# Patient Record
Sex: Female | Born: 1995 | Race: White | Hispanic: No | Marital: Single | Smoking: Never smoker
Health system: Southern US, Community
[De-identification: ages and names within clinical notes are randomized; demographics above are authoritative.]

## PROBLEM LIST (undated history)

## (undated) DIAGNOSIS — E669 Obesity, unspecified: Secondary | ICD-10-CM

## (undated) DIAGNOSIS — J45909 Unspecified asthma, uncomplicated: Secondary | ICD-10-CM

## (undated) DIAGNOSIS — N926 Irregular menstruation, unspecified: Secondary | ICD-10-CM

## (undated) HISTORY — DX: Irregular menstruation, unspecified: N92.6

## (undated) HISTORY — DX: Obesity, unspecified: E66.9

## (undated) HISTORY — DX: Unspecified asthma, uncomplicated: J45.909

---
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
## 2015-06-18 ENCOUNTER — Encounter: Payer: Self-pay | Admitting: *Deleted

## 2015-06-24 ENCOUNTER — Encounter: Payer: Self-pay | Admitting: Obstetrics and Gynecology

## 2015-06-24 ENCOUNTER — Ambulatory Visit (INDEPENDENT_AMBULATORY_CARE_PROVIDER_SITE_OTHER): Payer: Medicaid Other | Admitting: Obstetrics and Gynecology

## 2015-06-24 VITALS — BP 109/76 | HR 81 | Ht 61.0 in | Wt 202.5 lb

## 2015-06-24 DIAGNOSIS — Z309 Encounter for contraceptive management, unspecified: Secondary | ICD-10-CM | POA: Diagnosis not present

## 2015-06-24 DIAGNOSIS — E669 Obesity, unspecified: Secondary | ICD-10-CM | POA: Diagnosis not present

## 2015-06-24 DIAGNOSIS — Z01419 Encounter for gynecological examination (general) (routine) without abnormal findings: Secondary | ICD-10-CM | POA: Diagnosis not present

## 2015-06-24 DIAGNOSIS — Z30011 Encounter for initial prescription of contraceptive pills: Secondary | ICD-10-CM | POA: Insufficient documentation

## 2015-06-24 MED ORDER — NORETHIN-ETH ESTRADIOL-FE 0.8-25 MG-MCG PO CHEW: 1.0000 | CHEWABLE_TABLET | Freq: Every day | ORAL | Status: DC

## 2015-06-24 NOTE — Progress Notes (Signed)
  Subjective:     Shannon Alvarado is a 19 y.o. female and is here for a comprehensive physical exam. 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  Social History   Social History  . Marital Status: Single    Spouse Name: N/A  . Number of Children: N/A  . Years of Education: N/A   Occupational History  . Not on file.   Social History Main Topics  . Smoking status: Never Smoker   . Smokeless tobacco: Never Used  . Alcohol Use: No  . Drug Use: No  . Sexual Activity: No   Other Topics Concern  . Not on file   Social History Narrative   Health Maintenance  Topic Date Due  . HIV Screening  06/27/2010  . TETANUS/TDAP  06/27/2014  . INFLUENZA VACCINE  06/01/2015    The following portions of the patient's history were reviewed and updated as appropriate: allergies, current medications, past family history, past medical history, past social history, past surgical history and problem list.  Review of Systems A comprehensive review of systems was negative.   Objective:    General appearance: alert, cooperative, appears stated age and moderately obese Neck: no adenopathy, no carotid bruit, no JVD, supple, symmetrical, trachea midline and thyroid not enlarged, symmetric, no tenderness/mass/nodules Lungs: clear to auscultation bilaterally Breasts: normal appearance, no masses or tenderness Heart: regular rate and rhythm, S1, S2 normal, no murmur, click, rub or gallop Abdomen: soft, non-tender; bowel sounds normal; no masses,  no organomegaly Pelvic: deferred    Assessment:    Healthy female exam. Obesity OCP user      Plan:  Pap not indicated Rx for 1 year refills on OCP RTC 1 year or as needed    See After Visit Summary for Counseling Recommendations

## 2015-06-24 NOTE — Patient Instructions (Signed)
  Place annual gynecologic exam patient instructions here.  Thank you for enrolling in MyChart. Please follow the instructions below to securely access your online medical record. MyChart allows you to send messages to your doctor, view your test results, manage appointments, and more.   How Do I Sign Up? 1. In your Internet browser, go to [employer] and enter [email or web address]. 2. Click on the Sign Up Now link in the Sign In box. You will see the New Member Sign Up page. 3. Enter your MyChart Access Code exactly as it appears below. You will not need to use this code after you've completed the sign-up process. If you do not sign up before the expiration date, you must request a new code.  MyChart Access Code: [email or web address] Expires: 08/23/2015  3:04 PM  4. Enter your Social Security Number ([id number]) and Date of Birth (mm/dd/yyyy) as indicated and click Submit. You will be taken to the next sign-up page. 5. Create a MyChart ID. This will be your MyChart login ID and cannot be changed, so think of one that is secure and easy to remember. 6. Create a MyChart password. You can change your password at any time. 7. Enter your Password Reset Question and Answer. This can be used at a later time if you forget your password.  8. Enter your e-mail address. You will receive e-mail notification when new information is available in MyChart. 9. Click Sign Up. You can now view your medical record.   Additional Information Remember, MyChart is NOT to be used for urgent needs. For medical emergencies, dial 911.

## 2015-10-08 ENCOUNTER — Ambulatory Visit: Payer: Medicaid Other | Admitting: Family Medicine

## 2015-11-27 ENCOUNTER — Emergency Department: Payer: Medicaid Other

## 2015-11-27 ENCOUNTER — Encounter: Payer: Self-pay | Admitting: *Deleted

## 2015-11-27 ENCOUNTER — Emergency Department
Admission: EM | Admit: 2015-11-27 | Discharge: 2015-11-27 | Disposition: A | Discharge status: Home / Self Care | Payer: Medicaid Other | Attending: Emergency Medicine | Admitting: Emergency Medicine

## 2015-11-27 DIAGNOSIS — Y9389 Activity, other specified: Secondary | ICD-10-CM | POA: Insufficient documentation

## 2015-11-27 DIAGNOSIS — S3992XA Unspecified injury of lower back, initial encounter: Secondary | ICD-10-CM | POA: Diagnosis present

## 2015-11-27 DIAGNOSIS — Y998 Other external cause status: Secondary | ICD-10-CM | POA: Diagnosis not present

## 2015-11-27 DIAGNOSIS — Y92009 Unspecified place in unspecified non-institutional (private) residence as the place of occurrence of the external cause: Secondary | ICD-10-CM | POA: Insufficient documentation

## 2015-11-27 DIAGNOSIS — W010XXA Fall on same level from slipping, tripping and stumbling without subsequent striking against object, initial encounter: Secondary | ICD-10-CM | POA: Insufficient documentation

## 2015-11-27 DIAGNOSIS — S300XXA Contusion of lower back and pelvis, initial encounter: Secondary | ICD-10-CM | POA: Diagnosis not present

## 2015-11-27 DIAGNOSIS — Z3202 Encounter for pregnancy test, result negative: Secondary | ICD-10-CM | POA: Insufficient documentation

## 2015-11-27 DIAGNOSIS — M62830 Muscle spasm of back: Secondary | ICD-10-CM | POA: Diagnosis not present

## 2015-11-27 DIAGNOSIS — Z79899 Other long term (current) drug therapy: Secondary | ICD-10-CM | POA: Diagnosis not present

## 2015-11-27 LAB — URINALYSIS COMPLETE WITH MICROSCOPIC (ARMC ONLY)
BACTERIA UA: NONE SEEN
Bilirubin Urine: NEGATIVE
GLUCOSE, UA: NEGATIVE mg/dL
Ketones, ur: NEGATIVE mg/dL
NITRITE: NEGATIVE
PROTEIN: 100 mg/dL — AB
SPECIFIC GRAVITY, URINE: 1.023 (ref 1.005–1.030)
pH: 7 (ref 5.0–8.0)

## 2015-11-27 LAB — POCT PREGNANCY, URINE: Preg Test, Ur: NEGATIVE

## 2015-11-27 MED ORDER — HYDROCODONE-ACETAMINOPHEN 5-325 MG PO TABS
1.0000 | ORAL_TABLET | Freq: Once | ORAL | Status: AC
  Administered 2015-11-27: 1 via ORAL
  Filled 2015-11-27: qty 1

## 2015-11-27 MED ORDER — CYCLOBENZAPRINE HCL 10 MG PO TABS: 10.0000 mg | ORAL_TABLET | Freq: Three times a day (TID) | ORAL | Status: DC | PRN

## 2015-11-27 MED ORDER — MELOXICAM 15 MG PO TABS: 15.0000 mg | ORAL_TABLET | Freq: Every day | ORAL | Status: DC

## 2015-11-27 NOTE — ED Provider Notes (Signed)
[facility] Emergency Department Provider Note  ____________________________________________  Time seen: Approximately 12:24 PM  I have reviewed the triage vital signs and the nursing notes.   HISTORY  Chief Complaint Back Pain    HPI SHAUNE MALACARA is a 20 y.o. female who presents today after falling twice Sunday morning outside when it was raining. She slipped and fell on the outside ramp to their house and fell again on the steps. States fell so hard that the step broke. Pain is in diffuse across lumbar back. Denies LOC or hitting head. Pain is worse when wearing bookbag to school, riding in car, or laying down. Patient became tearful during exam while laying supine. Has tried taking Ibuprofen and Tylenol with little relief. Describes pain as "hammer hitting back." Denies bruising to back earlier in week, fever, saddle anesthesia, bowel or bladder dysfunction, numbness or tingling, pain in legs, N/V, urinary symptoms.    Past Medical History  Diagnosis Date  . Asthma   . Obesity     Patient Active Problem List   Diagnosis Date Noted  . Obesity 06/24/2015  . OCP (oral contraceptive pills) initiation 06/24/2015    History reviewed. No pertinent past surgical history.  Current Outpatient Rx  Name  Route  Sig  Dispense  Refill  . cyclobenzaprine (FLEXERIL) 10 MG tablet   Oral   Take 1 tablet (10 mg total) by mouth 3 (three) times daily as needed for muscle spasms.   15 tablet   0   . meloxicam (MOBIC) 15 MG tablet   Oral   Take 1 tablet (15 mg total) by mouth daily.   30 tablet   0   . Norethindrone-Ethinyl Estradiol-Fe (GENERESS FE) 0.8-25 MG-MCG tablet   Oral   Chew 1 tablet by mouth daily.   1 Package   12     Allergies Review of patient's allergies indicates no known allergies.  Family History  Problem Relation Age of Onset  . Cancer Mother     ovarian  . Cancer Maternal Aunt     breast    Social History Social History   Substance Use Topics  . Smoking status: Never Smoker   . Smokeless tobacco: Never Used  . Alcohol Use: No     Review of Systems  Constitutional: No fever/chills Eyes: No visual changes. No discharge ENT: No sore throat. Cardiovascular: no chest pain. Respiratory: no cough. No SOB. Gastrointestinal: No abdominal pain.  No nausea, no vomiting.  No diarrhea.  No constipation. Genitourinary: Negative for dysuria. No hematuria Musculoskeletal: Positive for back pain. See HPI.  Skin: Negative for rash. Neurological: Negative for headaches, focal weakness or numbness. 10-point ROS otherwise negative.  ____________________________________________   PHYSICAL EXAM:  VITAL SIGNS: ED Triage Vitals  Enc Vitals Group     BP 11/27/15 1204 136/80 mmHg     Pulse Rate 11/27/15 1204 72     Resp 11/27/15 1204 20     Temp 11/27/15 1204 98.5 F (36.9 C)     Temp Source 11/27/15 1204 Oral     SpO2 11/27/15 1204 99 %     Weight 11/27/15 1204 194 lb (87.998 kg)     Height 11/27/15 1204  (1.549 m)     Head Cir --      Peak Flow --      Pain Score 11/27/15 1204 6     Pain Loc --      Pain Edu? --  Excl. in GC? --      Constitutional: Alert and oriented. Well appearing and in no acute distress. Eyes: Conjunctivae are normal. PERRL. EOMI. Head: Atraumatic. ENT:      Ears:       Nose: No congestion/rhinnorhea.      Mouth/Throat: Mucous membranes are moist.  Neck: No stridor.  No cervical spine tenderness to palpation. Hematological/Lymphatic/Immunilogical: No cervical lymphadenopathy. Cardiovascular: Normal rate, regular rhythm. Normal S1 and S2.  Good peripheral circulation. Respiratory: Normal respiratory effort without tachypnea or retractions. Lungs CTAB. Gastrointestinal: Soft and nontender. No distention. No CVA tenderness. Musculoskeletal: Diffuse tenderness through lumbar spine and paravertebrals bilaterally. No point tenderness. Slight muscles spasms felt on the right  lumbar paravertebral. No ecchymosis. ROM for spine is intact. No lower extremity tenderness nor edema.  ROM and strength is equal bilaterally for lower extremities. Negative straight leg test. No joint effusions. Neurologic:  Normal speech and language. No gross focal neurologic deficits are appreciated.  Skin:  Skin is warm, dry and intact. No rash noted.  Psychiatric: Mood and affect are normal. Speech and behavior are normal. Patient exhibits appropriate insight and judgement.   ____________________________________________   LABS (all labs ordered are listed, but only abnormal results are displayed)  Labs Reviewed  URINALYSIS COMPLETEWITH MICROSCOPIC ([facility] ONLY) - Abnormal; Notable for the following:    Color, Urine YELLOW (*)    APPearance HAZY (*)    Hgb urine dipstick 1+ (*)    Protein, ur 100 (*)    Leukocytes, UA TRACE (*)    Squamous Epithelial / LPF 0-5 (*)    All other components within normal limits  POC URINE PREG, ED  POCT PREGNANCY, URINE   ____________________________________________  EKG   ____________________________________________  RADIOLOGY Festus Barren Cuthriell, personally viewed and evaluated these images (plain radiographs) as part of my medical decision making, as well as reviewing the written report by the radiologist.  Dg Lumbar Spine Complete  11/27/2015  CLINICAL DATA:  Larey Seat on Sunday, low back pain since, no prior injury, initial encounter EXAM: LUMBAR SPINE - COMPLETE 4+ VIEW COMPARISON:  None FINDINGS: 5 non-rib-bearing lumbar vertebra. Osseous mineralization grossly normal for technique. Vertebral body and disc space heights maintained. No acute fracture, subluxation or bone destruction. No spondylolysis. SI joints symmetric. IMPRESSION: Normal exam. Electronically Signed   By: Ulyses Southward M.D.   On: 11/27/2015 13:28    ____________________________________________    PROCEDURES  Procedure(s) performed:       Medications   HYDROcodone-acetaminophen (NORCO/VICODIN) 5-325 MG per tablet 1 tablet (1 tablet Oral Given 11/27/15 1245)     ____________________________________________   INITIAL IMPRESSION / ASSESSMENT AND PLAN / ED COURSE  Pertinent labs & imaging results that were available during my care of the patient were reviewed by me and considered in my medical decision making (see chart for details).  Patient's diagnosis is consistent with lumbar contusion and paraspinal muscle spasm. Patient will be discharged home with prescriptions for Meloxicam and Flexeril. Patient is to follow up with Dr. Martha Clan if symptoms persist past this treatment course. Patient is given ED precautions to return to the ED for any worsening or new symptoms.     ____________________________________________  FINAL CLINICAL IMPRESSION(S) / ED DIAGNOSES  Final diagnoses:  Lumbar contusion, initial encounter  Paraspinal muscle spasm      NEW MEDICATIONS STARTED DURING THIS VISIT:  New Prescriptions   CYCLOBENZAPRINE (FLEXERIL) 10 MG TABLET    Take 1 tablet (10 mg total) by mouth 3 (  three) times daily as needed for muscle spasms.   MELOXICAM (MOBIC) 15 MG TABLET    Take 1 tablet (15 mg total) by mouth daily.        Delorise Royals Cuthriell, PA-C 11/27/15 1417  Minna Antis, MD 11/28/15 1426

## 2015-11-27 NOTE — Discharge Instructions (Signed)
Back Exercises °The following exercises strengthen the muscles that help to support the back. They also help to keep the lower back flexible. Doing these exercises can help to prevent back pain or lessen existing pain. °If you have back pain or discomfort, try doing these exercises 2-3 times each day or as told by your health care provider. When the pain goes away, do them once each day, but increase the number of times that you repeat the steps for each exercise (do more repetitions). If you do not have back pain or discomfort, do these exercises once each day or as told by your health care provider. °EXERCISES °Single Knee to Chest °Repeat these steps 3-5 times for each leg: °· Lie on your back on a firm bed or the floor with your legs extended. °· Bring one knee to your chest. Your other leg should stay extended and in contact with the floor. °· Hold your knee in place by grabbing your knee or thigh. °· Pull on your knee until you feel a gentle stretch in your lower back. °· Hold the stretch for 10-30 seconds. °· Slowly release and straighten your leg. °Pelvic Tilt °Repeat these steps 5-10 times: °· Lie on your back on a firm bed or the floor with your legs extended. °· Bend your knees so they are pointing toward the ceiling and your feet are flat on the floor. °· Tighten your lower abdominal muscles to press your lower back against the floor. This motion will tilt your pelvis so your tailbone points up toward the ceiling instead of pointing to your feet or the floor. °· With gentle tension and even breathing, hold this position for 5-10 seconds. °Cat-Cow °Repeat these steps until your lower back becomes more flexible: °· Get into a hands-and-knees position on a firm surface. Keep your hands under your shoulders, and keep your knees under your hips. You may place padding under your knees for comfort. °· Let your head hang down, and point your tailbone toward the floor so your lower back becomes rounded like the  back of a cat. °· Hold this position for 5 seconds. °· Slowly lift your head and point your tailbone up toward the ceiling so your back forms a sagging arch like the back of a cow. °· Hold this position for 5 seconds. °Press-Ups °Repeat these steps 5-10 times: °· Lie on your abdomen (face-down) on the floor. °· Place your palms near your head, about shoulder-width apart. °· While you keep your back as relaxed as possible and keep your hips on the floor, slowly straighten your arms to raise the top half of your body and lift your shoulders. Do not use your back muscles to raise your upper torso. You may adjust the placement of your hands to make yourself more comfortable. °· Hold this position for 5 seconds while you keep your back relaxed. °· Slowly return to lying flat on the floor. °Bridges °Repeat these steps 10 times: °1. Lie on your back on a firm surface. °2. Bend your knees so they are pointing toward the ceiling and your feet are flat on the floor. °3. Tighten your buttocks muscles and lift your buttocks off of the floor until your waist is at almost the same height as your knees. You should feel the muscles working in your buttocks and the back of your thighs. If you do not feel these muscles, slide your feet 1-2 inches farther away from your buttocks. °4. Hold this position for 3-5   seconds. 5. Slowly lower your hips to the starting position, and allow your buttocks muscles to relax completely. If this exercise is too easy, try doing it with your arms crossed over your chest. Abdominal Crunches Repeat these steps 5-10 times: 1. Lie on your back on a firm bed or the floor with your legs extended. 2. Bend your knees so they are pointing toward the ceiling and your feet are flat on the floor. 3. Cross your arms over your chest. 4. Tip your chin slightly toward your chest without bending your neck. 5. Tighten your abdominal muscles and slowly raise your trunk (torso) high enough to lift your shoulder  blades a tiny bit off of the floor. Avoid raising your torso higher than that, because it can put too much stress on your low back and it does not help to strengthen your abdominal muscles. 6. Slowly return to your starting position. Back Lifts Repeat these steps 5-10 times: 1. Lie on your abdomen (face-down) with your arms at your sides, and rest your forehead on the floor. 2. Tighten the muscles in your legs and your buttocks. 3. Slowly lift your chest off of the floor while you keep your hips pressed to the floor. Keep the back of your head in line with the curve in your back. Your eyes should be looking at the floor. 4. Hold this position for 3-5 seconds. 5. Slowly return to your starting position. SEEK MEDICAL CARE IF:  Your back pain or discomfort gets much worse when you do an exercise.  Your back pain or discomfort does not lessen within 2 hours after you exercise. If you have any of these problems, stop doing these exercises right away. Do not do them again unless your health care provider says that you can. SEEK IMMEDIATE MEDICAL CARE IF:  You develop sudden, severe back pain. If this happens, stop doing the exercises right away. Do not do them again unless your health care provider says that you can.   This information is not intended to replace advice given to you by your health care provider. Make sure you discuss any questions you have with your health care provider.   Document Released: 11/24/2004 Document Revised: 07/08/2015 Document Reviewed: 12/11/2014 Elsevier Interactive Patient Education 2016 [employer]  Contusion A contusion is a deep bruise. Contusions are the result of a blunt injury to tissues and muscle fibers under the skin. The injury causes bleeding under the skin. The skin overlying the contusion may turn blue, purple, or yellow. Minor injuries will give you a painless contusion, but more severe contusions may stay painful and swollen for a few weeks.    CAUSES  This condition is usually caused by a blow, trauma, or direct force to an area of the body. SYMPTOMS  Symptoms of this condition include:  Swelling of the injured area.  Pain and tenderness in the injured area.  Discoloration. The area may have redness and then turn blue, purple, or yellow. DIAGNOSIS  This condition is diagnosed based on a physical exam and medical history. An X-ray, CT scan, or MRI may be needed to determine if there are any associated injuries, such as broken bones (fractures). TREATMENT  Specific treatment for this condition depends on what area of the body was injured. In general, the best treatment for a contusion is resting, icing, applying pressure to (compression), and elevating the injured area. This is often called the RICE strategy. Over-the-counter anti-inflammatory medicines may also be recommended for pain control.  HOME CARE INSTRUCTIONS   Rest the injured area.  If directed, apply ice to the injured area:  Put ice in a plastic bag.  Place a towel between your skin and the bag.  Leave the ice on for 20 minutes, 2-3 times per day.  If directed, apply light compression to the injured area using an elastic bandage. Make sure the bandage is not wrapped too tightly. Remove and reapply the bandage as directed by your health care provider.  If possible, raise (elevate) the injured area above the level of your heart while you are sitting or lying down.  Take over-the-counter and prescription medicines only as told by your health care provider. SEEK MEDICAL CARE IF:  Your symptoms do not improve after several days of treatment.  Your symptoms get worse.  You have difficulty moving the injured area. SEEK IMMEDIATE MEDICAL CARE IF:   You have severe pain.  You have numbness in a hand or foot.  Your hand or foot turns pale or cold.   This information is not intended to replace advice given to you by your health care provider. Make sure you  discuss any questions you have with your health care provider.   Document Released: 07/27/2005 Document Revised: 07/08/2015 Document Reviewed: 03/04/2015 Elsevier Interactive Patient Education [employer]

## 2015-11-27 NOTE — ED Notes (Signed)
Patient states she has been taking Tylenol with minimal relief.

## 2015-11-27 NOTE — ED Notes (Signed)
Pt slipped and fell, complains of back pain, pt denies any other symptoms

## 2015-12-14 ENCOUNTER — Telehealth: Payer: Self-pay | Admitting: Obstetrics and Gynecology

## 2015-12-14 NOTE — Telephone Encounter (Signed)
Has a hard type on her cycle, the dog ate 3 of her BC pills. Do we have any samples for her. [place] Fe or what does she need to do. She is in a relationship and is concerned.

## 2015-12-15 NOTE — Telephone Encounter (Signed)
Pt is having BTB on her birth control, and also the dog ate 3 of her pills from her current pack, per her mother she always has BTB every month

## 2015-12-16 NOTE — Telephone Encounter (Signed)
Please send in auth to refill pills early, and have her double up for three days when she has BTB.

## 2015-12-21 NOTE — Telephone Encounter (Signed)
Done-ac 

## 2016-06-13 ENCOUNTER — Telehealth: Payer: Self-pay | Admitting: Obstetrics and Gynecology

## 2016-06-13 ENCOUNTER — Other Ambulatory Visit: Payer: Self-pay | Admitting: *Deleted

## 2016-06-13 ENCOUNTER — Other Ambulatory Visit: Payer: Self-pay | Admitting: Obstetrics and Gynecology

## 2016-06-13 MED ORDER — NORETHIN-ETH ESTRADIOL-FE 0.8-25 MG-MCG PO CHEW: 1.0000 | CHEWABLE_TABLET | Freq: Every day | ORAL | 3 refills | Status: DC

## 2016-06-13 NOTE — Telephone Encounter (Signed)
Pt is running out of refills and couldn't get an AE until Oct 6. Can you refill until then. (medicap)

## 2016-06-13 NOTE — Telephone Encounter (Signed)
Refilled OCP x 3-ac

## 2016-06-24 ENCOUNTER — Encounter: Payer: Medicaid Other | Admitting: Obstetrics and Gynecology

## 2016-08-04 ENCOUNTER — Telehealth: Payer: Self-pay | Admitting: Obstetrics and Gynecology

## 2016-08-04 ENCOUNTER — Other Ambulatory Visit: Payer: Self-pay | Admitting: *Deleted

## 2016-08-04 MED ORDER — NORETHIN-ETH ESTRADIOL-FE 0.8-25 MG-MCG PO CHEW: 1.0000 | CHEWABLE_TABLET | Freq: Every day | ORAL | 3 refills | Status: DC

## 2016-08-04 NOTE — Telephone Encounter (Signed)
Done-ac 

## 2016-08-04 NOTE — Telephone Encounter (Signed)
Pt started a new job and had to cancel her appt. To later. [facility] Please (medcap)

## 2016-08-05 ENCOUNTER — Encounter: Payer: Medicaid Other | Admitting: Obstetrics and Gynecology

## 2016-09-05 ENCOUNTER — Emergency Department
Admission: EM | Admit: 2016-09-05 | Discharge: 2016-09-05 | Disposition: A | Discharge status: Home / Self Care | Payer: Medicaid Other | Attending: Emergency Medicine | Admitting: Emergency Medicine

## 2016-09-05 DIAGNOSIS — J069 Acute upper respiratory infection, unspecified: Secondary | ICD-10-CM | POA: Insufficient documentation

## 2016-09-05 DIAGNOSIS — B9789 Other viral agents as the cause of diseases classified elsewhere: Secondary | ICD-10-CM

## 2016-09-05 DIAGNOSIS — J40 Bronchitis, not specified as acute or chronic: Secondary | ICD-10-CM

## 2016-09-05 LAB — POCT RAPID STREP A: Streptococcus, Group A Screen (Direct): NEGATIVE

## 2016-09-05 MED ORDER — BENZONATATE 100 MG PO CAPS
ORAL_CAPSULE | ORAL | Status: AC
Start: 2016-09-05 — End: 2016-09-05
  Administered 2016-09-05: 100 mg via ORAL
  Filled 2016-09-05: qty 2

## 2016-09-05 MED ORDER — BENZONATATE 100 MG PO CAPS: 100.0000 mg | ORAL_CAPSULE | Freq: Four times a day (QID) | ORAL | 0 refills | Status: DC | PRN

## 2016-09-05 MED ORDER — BENZONATATE 100 MG PO CAPS
100.0000 mg | ORAL_CAPSULE | Freq: Once | ORAL | Status: AC
  Administered 2016-09-05: 100 mg via ORAL

## 2016-09-05 NOTE — ED Notes (Signed)
Pt reports cough since last Wed and unable to sleep due to cough - pt reports vomiting due to the cough and sore throat - reports dry non-productive cough - denies nasal drainage - denies fever - reports shortness of breatn and a history of asthma - pt has been using inhaler and obtaining some relief

## 2016-09-05 NOTE — ED Notes (Signed)
Rapid strep negative.

## 2016-09-05 NOTE — Discharge Instructions (Signed)
°  Please take any medications prescribed and follow up as recommended with your regular doctor.  If you develop any new or worsening symptoms, including but not limited to fever, persistent vomiting, worsening shortness of breath, or other symptoms that concern you, please return to the Emergency Department immediately.

## 2016-09-05 NOTE — ED Triage Notes (Signed)
Pt presents to ED for cough, "a little bit of vomiting." and sore throat.  States symptoms since last Wednesday. Denies fever, or diarrhea. 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

## 2016-09-05 NOTE — ED Provider Notes (Signed)
[facility] Emergency Department Provider Note   ____________________________________________   First MD Initiated Contact with Patient 09/05/16 1641     (approximate)  I have reviewed the triage vital signs and the nursing notes.   HISTORY  Chief Complaint Cough    HPI Shannon Alvarado is a 20 y.o. female no significant medical history. Patient reports that since Wednesday she had "cold" with clear runny nose, frequent dry cough, and feeling short of breath during coughing episodes. In the evening when she lays down she is having hard time sleeping because the the persistent cough. No productive sputum, no fever, she does report vomiting sometimes as the coughing episodes induce her to vomit but she denies abdominal pain or nausea.  She denies pregnancy today.  No pain.  Past Medical History:  Diagnosis Date  . Asthma   . Obesity     Patient Active Problem List   Diagnosis Date Noted  . Obesity 06/24/2015  . OCP (oral contraceptive pills) initiation 06/24/2015    History reviewed. No pertinent surgical history.  Prior to Admission medications   Medication Sig Start Date End Date Taking? Authorizing Provider  benzonatate (TESSALON PERLES) 100 MG capsule Take 1 capsule (100 mg total) by mouth every 6 (six) hours as needed for cough. 09/05/16   Sharyn CreamerMark Wheeler Incorvaia, MD  cyclobenzaprine (FLEXERIL) 10 MG tablet Take 1 tablet (10 mg total) by mouth 3 (three) times daily as needed for muscle spasms. 11/27/15   Delorise RoyalsJonathan D Cuthriell, PA-C  meloxicam (MOBIC) 15 MG tablet Take 1 tablet (15 mg total) by mouth daily. 11/27/15   Delorise RoyalsJonathan D Cuthriell, PA-C  Norethindrone-Ethinyl Estradiol-Fe (GENERESS FE) 0.8-25 MG-MCG tablet Chew 1 tablet by mouth daily. 08/04/16   Melody Suzan NailerN Shambley, CNM    Allergies Patient has no known allergies.  Family History  Problem Relation Age of Onset  . Cancer Mother     ovarian  . Cancer Maternal Aunt     breast    Social  History Social History  Substance Use Topics  . Smoking status: Never Smoker  . Smokeless tobacco: Never Used  . Alcohol use No    Review of Systems Constitutional: No fever/chills Eyes: No visual changes. ENT: No sore throat. Cardiovascular: Denies chest pain. Respiratory: Denies shortness of breath except during coughing fits. Gastrointestinal: No abdominal pain.  No diarrhea.  No constipation. Genitourinary: Negative for dysuria. Musculoskeletal: Negative for back pain. Skin: Negative for rash. Neurological: Negative for headaches, focal weakness or numbness.  10-point ROS otherwise negative.  ____________________________________________   PHYSICAL EXAM:  VITAL SIGNS: ED Triage Vitals  Enc Vitals Group     BP 09/05/16 1613 140/79     Pulse Rate 09/05/16 1613 76     Resp 09/05/16 1613 18     Temp 09/05/16 1613 98.9 F (37.2 C)     Temp Source 09/05/16 1613 Oral     SpO2 09/05/16 1613 99 %     Weight 09/05/16 1613 177 lb (80.3 kg)     Height 09/05/16 1613 5\' 1"  (1.549 m)     Head Circumference --      Peak Flow --      Pain Score 09/05/16 1617 7     Pain Loc --      Pain Edu? --      Excl. in GC? --     Constitutional: Alert and oriented. Well appearing and in no acute distress.Very pleasant is a frequent dry cough. Eyes: Conjunctivae are normal.  PERRL. EOMI. Head: Atraumatic. Nose: No congestion/rhinnorhea. Mouth/Throat: Mucous membranes are moist.  Oropharynx non-erythematous. Neck: No stridor.  No meningismus Cardiovascular: Normal rate, regular rhythm. Grossly normal heart sounds.  Good peripheral circulation. Respiratory: Normal respiratory effort.  No retractions. Lungs CTAB. Patient does have a frequent dry cough. No evidence of difficulty breathing or increased work of breathing. Gastrointestinal: Soft and nontender. No distention. Musculoskeletal: No lower extremity tenderness nor edema.  No joint effusions. Neurologic:  Normal speech and language. No  gross focal neurologic deficits are appreciated. No gait instability. Skin:  Skin is warm, dry and intact. No rash noted. Psychiatric: Mood and affect are normal. Speech and behavior are normal.  ____________________________________________   LABS (all labs ordered are listed, but only abnormal results are displayed)  Labs Reviewed  POCT RAPID STREP A   ____________________________________________  EKG   ____________________________________________  RADIOLOGY   ____________________________________________   PROCEDURES  Procedure(s) performed: None  Procedures  Critical Care performed: No  ____________________________________________   INITIAL IMPRESSION / ASSESSMENT AND PLAN / ED COURSE  Pertinent labs & imaging results that were available during my care of the patient were reviewed by me and considered in my medical decision making (see chart for details).  Cough, coryza, with frequent coughing episodes causing difficulty sleeping and the patient had to leave work today because of coughing and she works in [employer].  No chest pain, no hypoxia, afebrile presently, no pleuritic pain. No signs or symptoms suggest acute coronary syndrome, pneumonia, bacterial illness, or pulmonary embolism, or other acute emergent concern at this time.  We'll prescribe the patient Jerilynn Somessalon Perles, discuss the general risks of this medication with her primary prescribing. In addition, boyfriend will drive her home. Provide work no. Consultation careful return precautions and over-the-counter cough medication care. Patient agreeable.  Return precautions and treatment recommendations and follow-up discussed with the patient who is agreeable with the plan.   Clinical Course      ____________________________________________   FINAL CLINICAL IMPRESSION(S) / ED DIAGNOSES  Final diagnoses:  Viral URI with cough  Bronchitis      NEW MEDICATIONS STARTED DURING THIS  VISIT:  New Prescriptions   BENZONATATE (TESSALON PERLES) 100 MG CAPSULE    Take 1 capsule (100 mg total) by mouth every 6 (six) hours as needed for cough.     Note:  This document was prepared using Dragon voice recognition software and may include unintentional dictation errors.     Sharyn CreamerMark Taiki Buckwalter, MD 09/05/16 [phone]

## 2016-09-09 ENCOUNTER — Other Ambulatory Visit: Payer: Self-pay | Admitting: Obstetrics and Gynecology

## 2016-09-09 ENCOUNTER — Encounter: Payer: Self-pay | Admitting: Obstetrics and Gynecology

## 2016-09-09 ENCOUNTER — Ambulatory Visit (INDEPENDENT_AMBULATORY_CARE_PROVIDER_SITE_OTHER): Payer: Medicaid Other | Admitting: Obstetrics and Gynecology

## 2016-09-09 VITALS — BP 118/82 | HR 84 | Ht 61.0 in | Wt 180.5 lb

## 2016-09-09 DIAGNOSIS — Z0000 Encounter for general adult medical examination without abnormal findings: Secondary | ICD-10-CM

## 2016-09-09 DIAGNOSIS — Z01419 Encounter for gynecological examination (general) (routine) without abnormal findings: Secondary | ICD-10-CM

## 2016-09-09 MED ORDER — LEVONORGEST-ETH ESTRAD 91-DAY 0.15-0.03 &0.01 MG PO TABS: 1.0000 | ORAL_TABLET | Freq: Every day | ORAL | 4 refills | Status: DC

## 2016-09-09 NOTE — Progress Notes (Signed)
   Subjective:     Criselda PeachesShirell D Belling is a 20 y.o. female and is here for a comprehensive physical exam. The patient reports no problems.  Uses caffeine occasionally and drinks ETOH socially,. Never been a smoker.  Sexually active, denies any complaints. Currently on her period.  Works at [employer] at the [employer].   Stays active but does not routinely go to they gym or exercise.  Would like to switch [facility] back to [facility] so that she doesn;t have a period every month Declines flu.  Social History   Social History  . Marital status: Single    Spouse name: N/A  . Number of children: N/A  . Years of education: N/A   Occupational History  . Not on file.   Social History Main Topics  . Smoking status: Never Smoker  . Smokeless tobacco: Never Used  . Alcohol use No  . Drug use: No  . Sexual activity: Yes    Birth control/ protection: Pill   Other Topics Concern  . Not on file   Social History Narrative  . No narrative on file   Health Maintenance  Topic Date Due  . HIV Screening  06/27/2010  . TETANUS/TDAP  06/27/2014  . INFLUENZA VACCINE  05/31/2016  . PAP SMEAR  06/27/2016    The following portions of the patient's history were reviewed and updated as appropriate: allergies, current medications, past family history, past medical history, past social history, past surgical history and problem list.  Review of Systems Pertinent items noted in HPI and remainder of comprehensive ROS otherwise negative.   Objective:    BP 118/82   Pulse 84   Ht 5\' 1"  (1.549 m)   Wt 180 lb 8 oz (81.9 kg)   LMP 09/09/2016   BMI 34.11 kg/m  General appearance: alert, cooperative and appears stated age Head: Normocephalic, without obvious abnormality, atraumatic Eyes: conjunctivae/corneas clear. PERRL, EOM's intact. Fundi benign. Ears: external canals normal Nose: Nares normal. Septum midline. Mucosa normal. No drainage or sinus tenderness. Throat: lips, mucosa, and  tongue normal; teeth and gums normal Neck: no adenopathy, no carotid bruit, no JVD, supple, symmetrical, trachea midline and thyroid not enlarged, symmetric, no tenderness/mass/nodules Back: symmetric, no curvature. ROM normal. No CVA tenderness. Lungs: clear to auscultation bilaterally Breasts: normal appearance, no masses or tenderness Heart: regular rate and rhythm, S1, S2 normal, no murmur, click, rub or gallop Abdomen: suprapubic tenderness with palpation, soft, no other tenderness noted Pelvic: cervix normal in appearance, external genitalia normal, no adnexal masses or tenderness, no cervical motion tenderness, rectovaginal septum normal, uterus normal size, shape, and consistency and vagina normal without discharge, however, patient is currently on her period and dark red blood present in vagina. Pap done with HPV co-testing. Extremities: extremities normal, atraumatic, no cyanosis or edema Pulses: 2+ and symmetric Skin: Skin color, texture, turgor normal. No rashes or lesions Neurologic: Grossly normal    Assessment:    Healthy female exam.  OCP User    Obesity Plan:   [facility]. Follow up in 1 year or sooner if needed.  Smiley HousemanMelissa Khian Remo, RN, Student FNP   See After Visit Summary for Counseling Recommendations

## 2016-09-09 NOTE — Patient Instructions (Signed)
  Place annual gynecologic exam patient instructions here.  Thank you for enrolling in MyChart. Please follow the instructions below to securely access your online medical record. MyChart allows you to send messages to your doctor, view your test results, manage appointments, and more.   How Do I Sign Up? 1. In your Internet browser, go to [employer] and enter [email or web address]. 2. Click on the Sign Up Now link in the Sign In box. You will see the New Member Sign Up page. 3. Enter your MyChart Access Code exactly as it appears below. You will not need to use this code after you've completed the sign-up process. If you do not sign up before the expiration date, you must request a new code.  MyChart Access Code: [email or web address] Expires: 11/08/2016  8:29 AM  4. Enter your Social Security Number ([id number]) and Date of Birth (mm/dd/yyyy) as indicated and click Submit. You will be taken to the next sign-up page. 5. Create a MyChart ID. This will be your MyChart login ID and cannot be changed, so think of one that is secure and easy to remember. 6. Create a MyChart password. You can change your password at any time. 7. Enter your Password Reset Question and Answer. This can be used at a later time if you forget your password.  8. Enter your e-mail address. You will receive e-mail notification when new information is available in MyChart. 9. Click Sign Up. You can now view your medical record.   Additional Information Remember, MyChart is NOT to be used for urgent needs. For medical emergencies, dial 911.

## 2016-09-12 LAB — CYTOLOGY - PAP

## 2016-12-08 ENCOUNTER — Emergency Department
Admission: EM | Admit: 2016-12-08 | Discharge: 2016-12-08 | Disposition: A | Discharge status: Home / Self Care | Payer: Medicaid Other | Attending: Emergency Medicine | Admitting: Emergency Medicine

## 2016-12-08 ENCOUNTER — Encounter: Payer: Self-pay | Admitting: Emergency Medicine

## 2016-12-08 DIAGNOSIS — Z79899 Other long term (current) drug therapy: Secondary | ICD-10-CM | POA: Insufficient documentation

## 2016-12-08 DIAGNOSIS — J069 Acute upper respiratory infection, unspecified: Secondary | ICD-10-CM | POA: Insufficient documentation

## 2016-12-08 DIAGNOSIS — J45909 Unspecified asthma, uncomplicated: Secondary | ICD-10-CM | POA: Insufficient documentation

## 2016-12-08 DIAGNOSIS — B9789 Other viral agents as the cause of diseases classified elsewhere: Secondary | ICD-10-CM

## 2016-12-08 MED ORDER — PREDNISONE 10 MG PO TABS: 40.0000 mg | ORAL_TABLET | Freq: Every day | ORAL | 0 refills | Status: DC

## 2016-12-08 MED ORDER — GUAIFENESIN-CODEINE 100-10 MG/5ML PO SYRP: 5.0000 mL | ORAL_SOLUTION | Freq: Three times a day (TID) | ORAL | 0 refills | Status: AC | PRN

## 2016-12-08 MED ORDER — IPRATROPIUM-ALBUTEROL 0.5-2.5 (3) MG/3ML IN SOLN
RESPIRATORY_TRACT | Status: AC
  Filled 2016-12-08: qty 3

## 2016-12-08 MED ORDER — IPRATROPIUM-ALBUTEROL 0.5-2.5 (3) MG/3ML IN SOLN
3.0000 mL | Freq: Once | RESPIRATORY_TRACT | Status: AC
  Administered 2016-12-08: 3 mL via RESPIRATORY_TRACT

## 2016-12-08 MED ORDER — AZITHROMYCIN 250 MG PO TABS: ORAL_TABLET | ORAL | 0 refills | Status: DC

## 2016-12-08 MED ORDER — ALBUTEROL SULFATE HFA 108 (90 BASE) MCG/ACT IN AERS: 2.0000 | INHALATION_SPRAY | Freq: Four times a day (QID) | RESPIRATORY_TRACT | 0 refills | Status: DC | PRN

## 2016-12-08 NOTE — ED Provider Notes (Signed)
[facility] Emergency Department Provider Note  ____________________________________________  Time seen: Approximately 1:39 PM  I have reviewed the triage vital signs and the nursing notes.   HISTORY  Chief Complaint Cough    HPI Shannon Alvarado is a 21 y.o. female events emergency department with a nonproductive cough for 2 days. Cough is worse at night. Patient states that when she is coughing, it is difficult to breathe. Patient states that she has a history of asthma and uses an albuterol inhaler. Patient has not used her albuterol inhaler today. Patient used to use a nebulizer machine but no longer owns one. Patient denies any other cold symptoms. Patient denies fever, headache, congestion, chest pain, nausea, vomiting, abdominal pain, diarrhea, constipation.   Past Medical History:  Diagnosis Date  . Asthma   . Obesity     Patient Active Problem List   Diagnosis Date Noted  . Obesity 06/24/2015  . OCP (oral contraceptive pills) initiation 06/24/2015    History reviewed. No pertinent surgical history.  Prior to Admission medications   Medication Sig Start Date End Date Taking? Authorizing Provider  albuterol (PROVENTIL HFA;VENTOLIN HFA) 108 (90 Base) MCG/ACT inhaler Inhale 2 puffs into the lungs every 6 (six) hours as needed for wheezing or shortness of breath. 12/08/16   Enid DerryAshley Geovany Trudo, PA-C  benzonatate (TESSALON PERLES) 100 MG capsule Take 1 capsule (100 mg total) by mouth every 6 (six) hours as needed for cough. 09/05/16   Sharyn CreamerMark Quale, MD  guaiFENesin-codeine [facility](ROBITUSSIN AC) 100-10 MG/5ML syrup Take 5 mLs by mouth 3 (three) times daily as needed for cough. 12/08/16 12/10/16  Enid DerryAshley Jamare Vanatta, PA-C  Levonorgestrel-Ethinyl Estradiol (SEASONIQUE) 0.15-0.03 &0.01 MG tablet Take 1 tablet by mouth daily. 09/09/16   Melody Suzan NailerN Shambley, CNM    Allergies Patient has no known allergies.  Family History  Problem Relation Age of Onset  . Cancer Mother    ovarian  . Ovarian cancer Mother   . Cancer Maternal Aunt     breast  . Breast cancer Maternal Aunt     Social History Social History  Substance Use Topics  . Smoking status: Never Smoker  . Smokeless tobacco: Never Used  . Alcohol use No     Review of Systems  Constitutional: No fever/chills Eyes: No visual changes. No discharge. ENT: Negative for congestion and rhinorrhea. Cardiovascular: No chest pain. Respiratory: Positive for cough. No SOB. Gastrointestinal: No abdominal pain.  No nausea, no vomiting.  No diarrhea.  No constipation. Musculoskeletal: Negative for musculoskeletal pain. Skin: Negative for rash, abrasions, lacerations, ecchymosis. Neurological: Negative for headaches.   ____________________________________________   PHYSICAL EXAM:  VITAL SIGNS: ED Triage Vitals  Enc Vitals Group     BP 12/08/16 1159 119/79     Pulse Rate 12/08/16 1159 90     Resp 12/08/16 1159 17     Temp 12/08/16 1159 98.2 F (36.8 C)     Temp Source 12/08/16 1159 Oral     SpO2 12/08/16 1159 100 %     Weight 12/08/16 1200 180 lb (81.6 kg)     Height 12/08/16 1200 5\' 1"  (1.549 m)     Head Circumference --      Peak Flow --      Pain Score 12/08/16 1200 5     Pain Loc --      Pain Edu? --      Excl. in GC? --      Constitutional: Alert and oriented. Well appearing and in no acute  distress. Eyes: Conjunctivae are normal. PERRL. EOMI. No discharge. Head: Atraumatic. ENT: No frontal and maxillary sinus tenderness.      Ears: Tympanic membranes pearly gray with good landmarks. No discharge.      Nose: Mild congestion/rhinnorhea.      Mouth/Throat: Mucous membranes are moist. Oropharynx non-erythematous. Tonsils not enlarged. No exudates. Uvula midline. Neck: No stridor.   Hematological/Lymphatic/Immunilogical: No cervical lymphadenopathy. Cardiovascular: Normal rate, regular rhythm. Good peripheral circulation. Respiratory: Normal respiratory effort without tachypnea or  retractions. Lungs CTAB. Good air entry to the bases with no decreased or absent breath sounds. Gastrointestinal: Bowel sounds 4 quadrants. Soft and nontender to palpation. No guarding or rigidity. No palpable masses. No distention. Musculoskeletal: Full range of motion to all extremities. No gross deformities appreciated. Neurologic:  Normal speech and language. No gross focal neurologic deficits are appreciated.  Skin:  Skin is warm, dry and intact. No rash noted. Psychiatric: Mood and affect are normal. Speech and behavior are normal. Patient exhibits appropriate insight and judgement.   ____________________________________________   LABS (all labs ordered are listed, but only abnormal results are displayed)  Labs Reviewed - No data to display ____________________________________________  EKG   ____________________________________________  RADIOLOGY   No results found.  ____________________________________________    PROCEDURES  Procedure(s) performed:    Procedures    Medications  ipratropium-albuterol (DUONEB) 0.5-2.5 (3) MG/3ML nebulizer solution (not administered)  ipratropium-albuterol (DUONEB) 0.5-2.5 (3) MG/3ML nebulizer solution 3 mL (3 mLs Nebulization Given 12/08/16 1246)     ____________________________________________   INITIAL IMPRESSION / ASSESSMENT AND PLAN / ED COURSE  Pertinent labs & imaging results that were available during my care of the patient were reviewed by me and considered in my medical decision making (see chart for details).  Review of the [place] CSRS was performed in accordance of the [facility] prior to dispensing any controlled drugs.     Patient's diagnosis is consistent with Viral URI with cough. Vital signs and exam are reassuring. Lungs are clear to auscultation bilaterally. SpO2 100%. Patient felt better after DuoNeb in ED. Patient was instructed to increase use of albuterol inhaler during symptoms. Patient was also given  guifenesin cough syrup for cough at night. Patient will be discharged home with prescriptions for albuterol inhaler and guifenesin cough syrup. Patient is to follow up with PCP to be evaluated for progressive asthma symptoms. Patient is given ED precautions to return to the ED for any worsening or new symptoms.     ____________________________________________  FINAL CLINICAL IMPRESSION(S) / ED DIAGNOSES  Final diagnoses:  Viral URI with cough      NEW MEDICATIONS STARTED DURING THIS VISIT:  Discharge Medication List as of 12/08/2016  1:17 PM    START taking these medications   Details  albuterol (PROVENTIL HFA;VENTOLIN HFA) 108 (90 Base) MCG/ACT inhaler Inhale 2 puffs into the lungs every 6 (six) hours as needed for wheezing or shortness of breath., Starting Thu 12/08/2016, Print    guaiFENesin-codeine (ROBITUSSIN AC) 100-10 MG/5ML syrup Take 5 mLs by mouth 3 (three) times daily as needed for cough., Starting Thu 12/08/2016, Until Sat 12/10/2016, Print            This chart was dictated using voice recognition software/Dragon. Despite best efforts to proofread, errors can occur which can change the meaning. Any change was purely unintentional.    Enid Derry, PA-C 12/08/16 1343    Governor Rooks, MD 12/08/16 [phone]

## 2016-12-08 NOTE — ED Triage Notes (Signed)
Patient presents to ED via POV with c/o cough x 3 days. Patient states, "I'm really just here for a work note so I don't lose my job". Patient denies coughing anything up. VSS. NAD noted.

## 2017-01-05 ENCOUNTER — Emergency Department: Payer: No Typology Code available for payment source

## 2017-01-05 ENCOUNTER — Encounter: Payer: Self-pay | Admitting: Emergency Medicine

## 2017-01-05 ENCOUNTER — Emergency Department
Admission: EM | Admit: 2017-01-05 | Discharge: 2017-01-05 | Disposition: A | Discharge status: Home / Self Care | Payer: No Typology Code available for payment source | Attending: Emergency Medicine | Admitting: Emergency Medicine

## 2017-01-05 DIAGNOSIS — J45909 Unspecified asthma, uncomplicated: Secondary | ICD-10-CM | POA: Diagnosis not present

## 2017-01-05 DIAGNOSIS — S8992XA Unspecified injury of left lower leg, initial encounter: Secondary | ICD-10-CM | POA: Diagnosis present

## 2017-01-05 DIAGNOSIS — S8002XA Contusion of left knee, initial encounter: Secondary | ICD-10-CM

## 2017-01-05 DIAGNOSIS — Y9389 Activity, other specified: Secondary | ICD-10-CM | POA: Diagnosis not present

## 2017-01-05 DIAGNOSIS — Z79899 Other long term (current) drug therapy: Secondary | ICD-10-CM | POA: Insufficient documentation

## 2017-01-05 DIAGNOSIS — S60222A Contusion of left hand, initial encounter: Secondary | ICD-10-CM

## 2017-01-05 DIAGNOSIS — Y92410 Unspecified street and highway as the place of occurrence of the external cause: Secondary | ICD-10-CM | POA: Diagnosis not present

## 2017-01-05 DIAGNOSIS — Y999 Unspecified external cause status: Secondary | ICD-10-CM | POA: Diagnosis not present

## 2017-01-05 MED ORDER — IBUPROFEN 600 MG PO TABS: 600.0000 mg | ORAL_TABLET | Freq: Three times a day (TID) | ORAL | 0 refills | Status: DC | PRN
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
## 2017-01-05 NOTE — ED Notes (Signed)
Ice pack provided upon discharge per PA order

## 2017-01-05 NOTE — Discharge Instructions (Signed)
[facility] your primary care doctor or [facility] if any continued problems. Apply ice to your hand and knee as needed for swelling. Ibuprofen 600 mg 3 times a day with food.
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
## 2017-08-31 ENCOUNTER — Other Ambulatory Visit: Payer: Self-pay | Admitting: Obstetrics and Gynecology

## 2017-08-31 ENCOUNTER — Telehealth: Payer: Self-pay | Admitting: Obstetrics and Gynecology

## 2017-08-31 NOTE — Telephone Encounter (Signed)
Done-ac 

## 2017-08-31 NOTE — Telephone Encounter (Signed)
Patient needs a refill on Birth control. She only has one pill left. She is scheduled for her annual 09/19/17. Thanks

## 2017-09-15 ENCOUNTER — Encounter: Payer: Medicaid Other | Admitting: Obstetrics and Gynecology

## 2017-09-19 ENCOUNTER — Encounter: Payer: Self-pay | Admitting: Obstetrics and Gynecology

## 2017-09-19 ENCOUNTER — Ambulatory Visit (INDEPENDENT_AMBULATORY_CARE_PROVIDER_SITE_OTHER): Payer: Medicaid Other | Admitting: Obstetrics and Gynecology

## 2017-09-19 VITALS — BP 128/84 | HR 76 | Ht 62.0 in | Wt 179.3 lb

## 2017-09-19 DIAGNOSIS — Z01419 Encounter for gynecological examination (general) (routine) without abnormal findings: Secondary | ICD-10-CM

## 2017-09-19 NOTE — Patient Instructions (Addendum)
Preventive Care 18-39 Years, Female Preventive care refers to lifestyle choices and visits with your health care provider that can promote health and wellness. What does preventive care include?  A yearly physical exam. This is also called an annual well check.  Dental exams once or twice a year.  Routine eye exams. Ask your health care provider how often you should have your eyes checked.  Personal lifestyle choices, including: ? Daily care of your teeth and gums. ? Regular physical activity. ? Eating a healthy diet. ? Avoiding tobacco and drug use. ? Limiting alcohol use. ? Practicing safe sex. ? Taking vitamin and mineral supplements as recommended by your health care provider. What happens during an annual well check? The services and screenings done by your health care provider during your annual well check will depend on your age, overall health, lifestyle risk factors, and family history of disease. Counseling Your health care provider may ask you questions about your:  Alcohol use.  Tobacco use.  Drug use.  Emotional well-being.  Home and relationship well-being.  Sexual activity.  Eating habits.  Work and work Statistician.  Method of birth control.  Menstrual cycle.  Pregnancy history.  Screening You may have the following tests or measurements:  Height, weight, and BMI.  Diabetes screening. This is done by checking your blood sugar (glucose) after you have not eaten for a while (fasting).  Blood pressure.  Lipid and cholesterol levels. These may be checked every 5 years starting at age 38.  Skin check.  Hepatitis C blood test.  Hepatitis B blood test.  Sexually transmitted disease (STD) testing.  BRCA-related cancer screening. This may be done if you have a family history of breast, ovarian, tubal, or peritoneal cancers.  Pelvic exam and Pap test. This may be done every 3 years starting at age 38. Starting at age 30, this may be done  every 5 years if you have a Pap test in combination with an HPV test.  Discuss your test results, treatment options, and if necessary, the need for more tests with your health care provider. Vaccines Your health care provider may recommend certain vaccines, such as:  Influenza vaccine. This is recommended every year.  Tetanus, diphtheria, and acellular pertussis (Tdap, Td) vaccine. You may need a Td booster every 10 years.  Varicella vaccine. You may need this if you have not been vaccinated.  HPV vaccine. If you are 39 or younger, you may need three doses over 6 months.  Measles, mumps, and rubella (MMR) vaccine. You may need at least one dose of MMR. You may also need a second dose.  Pneumococcal 13-valent conjugate (PCV13) vaccine. You may need this if you have certain conditions and were not previously vaccinated.  Pneumococcal polysaccharide (PPSV23) vaccine. You may need one or two doses if you smoke cigarettes or if you have certain conditions.  Meningococcal vaccine. One dose is recommended if you are age 68-21 years and a first-year college student living in a residence hall, or if you have one of several medical conditions. You may also need additional booster doses.  Hepatitis A vaccine. You may need this if you have certain conditions or if you travel or work in places where you may be exposed to hepatitis A.  Hepatitis B vaccine. You may need this if you have certain conditions or if you travel or work in places where you may be exposed to hepatitis B.  Haemophilus influenzae type b (Hib) vaccine. You may need this  you have certain risk factors.  Talk to your health care provider about which screenings and vaccines you need and how often you need them. This information is not intended to replace advice given to you by your health care provider. Make sure you discuss any questions you have with your health care provider. Document Released: 12/13/2001 Document Revised: 07/06/2016  Document Reviewed: 08/18/2015 Elsevier Interactive Patient Education  2017 Elsevier Inc. Levonorgestrel intrauterine device (IUD) What is this medicine? LEVONORGESTREL IUD (LEE voe nor jes trel) is a contraceptive (birth control) device. The device is placed inside the uterus by a healthcare professional. It is used to prevent pregnancy. This device can also be used to treat heavy bleeding that occurs during your period. This medicine may be used for other purposes; ask your health care provider or pharmacist if you have questions. COMMON BRAND NAME(S): Kyleena, LILETTA, Mirena, Skyla What should I tell my health care provider before I take this medicine? They need to know if you have any of these conditions: -abnormal Pap smear -cancer of the breast, uterus, or cervix -diabetes -endometritis -genital or pelvic infection now or in the past -have more than one sexual partner or your partner has more than one partner -heart disease -history of an ectopic or tubal pregnancy -immune system problems -IUD in place -liver disease or tumor -problems with blood clots or take blood-thinners -seizures -use intravenous drugs -uterus of unusual shape -vaginal bleeding that has not been explained -an unusual or allergic reaction to levonorgestrel, other hormones, silicone, or polyethylene, medicines, foods, dyes, or preservatives -pregnant or trying to get pregnant -breast-feeding How should I use this medicine? This device is placed inside the uterus by a health care professional. Talk to your pediatrician regarding the use of this medicine in children. Special care may be needed. Overdosage: If you think you have taken too much of this medicine contact a poison control center or emergency room at once. NOTE: This medicine is only for you. Do not share this medicine with others. What if I miss a dose? This does not apply. Depending on the brand of device you have inserted, the device will need  to be replaced every 3 to 5 years if you wish to continue using this type of birth control. What may interact with this medicine? Do not take this medicine with any of the following medications: -amprenavir -bosentan -fosamprenavir This medicine may also interact with the following medications: -aprepitant -armodafinil -barbiturate medicines for inducing sleep or treating seizures -bexarotene -boceprevir -griseofulvin -medicines to treat seizures like carbamazepine, ethotoin, felbamate, oxcarbazepine, phenytoin, topiramate -modafinil -pioglitazone -rifabutin -rifampin -rifapentine -some medicines to treat HIV infection like atazanavir, efavirenz, indinavir, lopinavir, nelfinavir, tipranavir, ritonavir -St. John's wort -warfarin This list may not describe all possible interactions. Give your health care provider a list of all the medicines, herbs, non-prescription drugs, or dietary supplements you use. Also tell them if you smoke, drink alcohol, or use illegal drugs. Some items may interact with your medicine. What should I watch for while using this medicine? Visit your doctor or health care professional for regular check ups. See your doctor if you or your partner has sexual contact with others, becomes HIV positive, or gets a sexual transmitted disease. This product does not protect you against HIV infection (AIDS) or other sexually transmitted diseases. You can check the placement of the IUD yourself by reaching up to the top of your vagina with clean fingers to feel the threads. Do not pull on the threads. It   is a good habit to check placement after each menstrual period. Call your doctor right away if you feel more of the IUD than just the threads or if you cannot feel the threads at all. The IUD may come out by itself. You may become pregnant if the device comes out. If you notice that the IUD has come out use a backup birth control method like condoms and call your health care  provider. Using tampons will not change the position of the IUD and are okay to use during your period. This IUD can be safely scanned with magnetic resonance imaging (MRI) only under specific conditions. Before you have an MRI, tell your healthcare provider that you have an IUD in place, and which type of IUD you have in place. What side effects may I notice from receiving this medicine? Side effects that you should report to your doctor or health care professional as soon as possible: -allergic reactions like skin rash, itching or hives, swelling of the face, lips, or tongue -fever, flu-like symptoms -genital sores -high blood pressure -no menstrual period for 6 weeks during use -pain, swelling, warmth in the leg -pelvic pain or tenderness -severe or sudden headache -signs of pregnancy -stomach cramping -sudden shortness of breath -trouble with balance, talking, or walking -unusual vaginal bleeding, discharge -yellowing of the eyes or skin Side effects that usually do not require medical attention (report to your doctor or health care professional if they continue or are bothersome): -acne -breast pain -change in sex drive or performance -changes in weight -cramping, dizziness, or faintness while the device is being inserted -headache -irregular menstrual bleeding within first 3 to 6 months of use -nausea This list may not describe all possible side effects. Call your doctor for medical advice about side effects. You may report side effects to FDA at 1-800-FDA-1088. Where should I keep my medicine? This does not apply. NOTE: This sheet is a summary. It may not cover all possible information. If you have questions about this medicine, talk to your doctor, pharmacist, or health care provider.  2018 Elsevier/Gold Standard (2016-07-29 14:14:56)  

## 2017-09-19 NOTE — Progress Notes (Signed)
  Subjective:     Shannon Alvarado is a white single  21 y.o. female and is here for a comprehensive physical exam. The patient reports problems - still having spotting when starting new pack each time. even with changes in type, desires an IUD.Marland Kitchen.  Social History   Socioeconomic History  . Marital status: Single    Spouse name: Not on file  . Number of children: Not on file  . Years of education: Not on file  . Highest education level: Not on file  Social Needs  . Financial resource strain: Not on file  . Food insecurity - worry: Not on file  . Food insecurity - inability: Not on file  . Transportation needs - medical: Not on file  . Transportation needs - non-medical: Not on file  Occupational History  . Not on file  Tobacco Use  . Smoking status: Never Smoker  . Smokeless tobacco: Never Used  Substance and Sexual Activity  . Alcohol use: No  . Drug use: No  . Sexual activity: Yes    Birth control/protection: Pill  Other Topics Concern  . Not on file  Social History Narrative  . Not on file   Health Maintenance  Topic Date Due  . HIV Screening  06/27/2010  . TETANUS/TDAP  06/27/2014  . INFLUENZA VACCINE  05/31/2017  . PAP SMEAR  09/10/2019    The following portions of the patient's history were reviewed and updated as appropriate: allergies, current medications, past family history, past medical history, past social history, past surgical history and problem list.  Review of Systems Pertinent items noted in HPI and remainder of comprehensive ROS otherwise negative.   Objective:    General appearance: alert, cooperative and appears stated age Neck: no adenopathy, no carotid bruit, supple, symmetrical, trachea midline and thyroid not enlarged, symmetric, no tenderness/mass/nodules Lungs: clear to auscultation bilaterally Breasts: normal appearance, no masses or tenderness Heart: regular rate and rhythm, S1, S2 normal, no murmur, click, rub or gallop Abdomen: soft,  non-tender; bowel sounds normal; no masses,  no organomegaly Pelvic: cervix normal in appearance, external genitalia normal, no adnexal masses or tenderness, no cervical motion tenderness, rectovaginal septum normal, uterus normal size, shape, and consistency and vagina normal without discharge Extremities: extremities normal, atraumatic, no cyanosis or edema    Assessment:    Healthy female exam. Obesity, OCP user-switching to IUD     Plan:  [facility] cytotec given for pm use night before.  Shannon Alvarado,CNM   See After Visit Summary for Counseling Recommendations

## 2017-09-20 NOTE — Addendum Note (Signed)
Addended by: Rosine BeatLONTZ, Mekiyah Gladwell L on: 09/20/2017 09:23 AM   Modules accepted: Orders

## 2017-09-23 LAB — GC/CHLAMYDIA PROBE AMP
CHLAMYDIA, DNA PROBE: NEGATIVE
NEISSERIA GONORRHOEAE BY PCR: NEGATIVE

## 2017-10-04 ENCOUNTER — Telehealth: Payer: Self-pay | Admitting: Obstetrics and Gynecology

## 2017-10-04 NOTE — Telephone Encounter (Signed)
Pt wants to know if she should cancel her appt for tomorrow 10/05/17. Pt states the pills are working and she has no more bleeding.

## 2017-10-04 NOTE — Telephone Encounter (Signed)
Done-ac 

## 2017-10-05 ENCOUNTER — Encounter: Payer: Medicaid Other | Admitting: Obstetrics and Gynecology

## 2017-11-21 ENCOUNTER — Other Ambulatory Visit: Payer: Self-pay | Admitting: Obstetrics and Gynecology

## 2017-11-21 ENCOUNTER — Telehealth: Payer: Self-pay | Admitting: Obstetrics and Gynecology

## 2017-11-21 NOTE — Telephone Encounter (Signed)
The patient called and stated that she needs a refill on B.C. No other information was disclosed. Please advise.

## 2017-11-22 NOTE — Telephone Encounter (Signed)
Done-ac 

## 2017-11-24 ENCOUNTER — Telehealth: Payer: Self-pay | Admitting: *Deleted

## 2017-11-24 ENCOUNTER — Other Ambulatory Visit: Payer: Self-pay | Admitting: *Deleted

## 2017-11-24 MED ORDER — LEVONORGEST-ETH ESTRAD 91-DAY 0.15-0.03 &0.01 MG PO TABS: 1.0000 | ORAL_TABLET | Freq: Every day | ORAL | 1 refills | Status: DC

## 2017-11-24 NOTE — Telephone Encounter (Signed)
Done-ac 

## 2017-11-24 NOTE — Telephone Encounter (Signed)
Patient called and states that Her pharmacy [facility] closed and she needs a refill sent to [facility] [address]. They also needs a Prior authorization before the patient can pick up her medication. Please advise. Thank you

## 2018-03-07 ENCOUNTER — Encounter: Payer: Self-pay | Admitting: Emergency Medicine

## 2018-03-07 ENCOUNTER — Other Ambulatory Visit: Payer: Self-pay

## 2018-03-07 ENCOUNTER — Emergency Department
Admission: EM | Admit: 2018-03-07 | Discharge: 2018-03-07 | Disposition: A | Discharge status: Home / Self Care | Payer: Self-pay | Attending: Student in an Organized Health Care Education/Training Program | Admitting: Student in an Organized Health Care Education/Training Program

## 2018-03-07 DIAGNOSIS — B9789 Other viral agents as the cause of diseases classified elsewhere: Secondary | ICD-10-CM | POA: Insufficient documentation

## 2018-03-07 DIAGNOSIS — J069 Acute upper respiratory infection, unspecified: Secondary | ICD-10-CM | POA: Insufficient documentation

## 2018-03-07 DIAGNOSIS — Z79899 Other long term (current) drug therapy: Secondary | ICD-10-CM | POA: Insufficient documentation

## 2018-03-07 DIAGNOSIS — J45909 Unspecified asthma, uncomplicated: Secondary | ICD-10-CM | POA: Insufficient documentation

## 2018-03-07 LAB — POCT PREGNANCY, URINE: Preg Test, Ur: NEGATIVE

## 2018-03-07 MED ORDER — PREDNISONE 50 MG PO TABS: ORAL_TABLET | ORAL | 0 refills | Status: DC

## 2018-03-07 NOTE — ED Notes (Signed)
poct pregnancy Negative 

## 2018-03-07 NOTE — ED Provider Notes (Signed)
[facility] Emergency Department Provider Note  ____________________________________________  Time seen: Approximately 4:27 PM  I have reviewed the triage vital signs and the nursing notes.   HISTORY  Chief Complaint Emesis    HPI Shannon Alvarado is a 22 y.o. female presents to the emergency department with rhinorrhea, congestion, nonproductive cough and some malaise for the past 2 days.  Patient reports that she has a history of asthma and has experienced posttussive emesis.  Patient reports that she experienced an episode of vomiting that was witnessed at work and is now required to have a note in order to return.  She denies chest pain, chest tightness, shortness of breath, nausea, vomiting and abdominal pain.  Patient would also like to be tested for pregnancy.   Past Medical History:  Diagnosis Date  . Asthma   . Irregular periods   . Obesity     Patient Active Problem List   Diagnosis Date Noted  . Obesity 06/24/2015  . OCP (oral contraceptive pills) initiation 06/24/2015    History reviewed. No pertinent surgical history.  Prior to Admission medications   Medication Sig Start Date End Date Taking? Authorizing Provider  GENERESS FE 0.8-25 MG-MCG tablet TAKE ONE TABLET BY MOUTH EVERY DAY 11/21/17   Shambley, Melody N, CNM  ibuprofen (ADVIL,MOTRIN) 600 MG tablet Take 1 tablet (600 mg total) by mouth every 8 (eight) hours as needed. Patient not taking: Reported on 09/19/2017 01/05/17   Tommi Rumps, PA-C  Levonorgestrel-Ethinyl Estradiol (SEASONIQUE) 0.15-0.03 &0.01 MG tablet Take 1 tablet by mouth daily. 11/24/17   Shambley, Melody N, CNM  predniSONE (DELTASONE) 50 MG tablet Take one 50 mg tablet once daily for the next 5 days. 03/07/18   Orvil Feil, PA-C    Allergies Patient has no known allergies.  Family History  Problem Relation Age of Onset  . Cancer Mother        ovarian  . Ovarian cancer Mother   . Cancer Maternal Aunt         breast  . Breast cancer Maternal Aunt     Social History Social History   Tobacco Use  . Smoking status: Never Smoker  . Smokeless tobacco: Never Used  Substance Use Topics  . Alcohol use: No  . Drug use: No      Review of Systems  Constitutional: No fever.  Eyes: No visual changes. No discharge ENT: Patient has congestion.  Cardiovascular: no chest pain. Respiratory: Patient has cough.  Gastrointestinal: No abdominal pain.  No nausea, no vomiting. Patient had diarrhea.  Genitourinary: Negative for dysuria. No hematuria Musculoskeletal: Patient has myalgias.  Skin: Negative for rash, abrasions, lacerations, ecchymosis. Neurological: Patient has headache, no focal weakness or numbness.     ____________________________________________   PHYSICAL EXAM:  VITAL SIGNS: ED Triage Vitals [03/07/18 1445]  Enc Vitals Group     BP (!) 142/97     Pulse Rate 65     Resp 16     Temp 99 F (37.2 C)     Temp Source Oral     SpO2 100 %     Weight 170 lb (77.1 kg)     Height  (1.575 m)     Head Circumference      Peak Flow      Pain Score 0     Pain Loc      Pain Edu?      Excl. in GC?      Constitutional: Alert and  oriented. Patient is lying supine. Eyes: Conjunctivae are normal. PERRL. EOMI. Head: Atraumatic. ENT:      Ears: Tympanic membranes are effused bilaterally.      Nose: No congestion/rhinnorhea.      Mouth/Throat: Mucous membranes are moist. Posterior pharynx is mildly erythematous.  Hematological/Lymphatic/Immunilogical: No cervical lymphadenopathy.  Cardiovascular: Normal rate, regular rhythm. Normal S1 and S2.  Good peripheral circulation. Respiratory: Normal respiratory effort without tachypnea or retractions. Lungs CTAB. Good air entry to the bases with no decreased or absent breath sounds. Gastrointestinal: Bowel sounds 4 quadrants. Soft and nontender to palpation. No guarding or rigidity. No palpable masses. No distention. No CVA  tenderness. Skin:  Skin is warm, dry and intact. No rash noted. Psychiatric: Mood and affect are normal. Speech and behavior are normal. Patient exhibits appropriate insight and judgement.  ____________________________________________   LABS (all labs ordered are listed, but only abnormal results are displayed)  Labs Reviewed  POC URINE PREG, ED  POCT PREGNANCY, URINE  POC URINE PREG, ED   ____________________________________________  EKG   ____________________________________________  RADIOLOGY   No results found.  ____________________________________________    PROCEDURES  Procedure(s) performed:    Procedures    Medications - No data to display   ____________________________________________   INITIAL IMPRESSION / ASSESSMENT AND PLAN / ED COURSE  Pertinent labs & imaging results that were available during my care of the patient were reviewed by me and considered in my medical decision making (see chart for details).  Review of the [place] CSRS was performed in accordance of the [facility] prior to dispensing any controlled drugs.     Assessment and plan Viral URI with cough Patient presents to the emergency department with rhinorrhea, congestion, nonproductive cough and some malaise for the past 2 days.  History and physical exam findings are consistent with a viral URI.  Patient was discharged with a course of prednisone and advised to follow-up with primary care as needed.  Patient tested negative for pregnancy.  All patient questions were answered.     ____________________________________________  FINAL CLINICAL IMPRESSION(S) / ED DIAGNOSES  Final diagnoses:  Viral URI with cough      NEW MEDICATIONS STARTED DURING THIS VISIT:  ED Discharge Orders        Ordered    predniSONE (DELTASONE) 50 MG tablet  Status:  Discontinued     03/07/18 1605    predniSONE (DELTASONE) 50 MG tablet     03/07/18 1621          This chart was dictated using  voice recognition software/Dragon. Despite best efforts to proofread, errors can occur which can change the meaning. Any change was purely unintentional.    Orvil Feil, PA-C 03/07/18 1631    Willy Eddy, MD 03/07/18 [phone]

## 2018-03-07 NOTE — ED Triage Notes (Signed)
Pt in via POV with complaints of cold this week with intermittent emesis x 2 days, sent home from work today due to same, unable to return without doctors note.  Pt states, "I just need a doctors note."  Vitals WDL, NAD noted at this time.

## 2018-06-04 ENCOUNTER — Other Ambulatory Visit: Payer: Self-pay | Admitting: Obstetrics and Gynecology

## 2018-06-11 ENCOUNTER — Encounter (INDEPENDENT_AMBULATORY_CARE_PROVIDER_SITE_OTHER): Payer: Self-pay

## 2018-06-11 ENCOUNTER — Ambulatory Visit: Payer: Self-pay | Attending: Oncology | Admitting: *Deleted

## 2018-06-11 ENCOUNTER — Encounter: Payer: Self-pay | Admitting: *Deleted

## 2018-06-11 ENCOUNTER — Encounter: Payer: Self-pay | Admitting: General Surgery

## 2018-06-11 VITALS — BP 120/81 | HR 59 | Temp 98.3°F | Ht 63.0 in | Wt 183.0 lb

## 2018-06-11 DIAGNOSIS — R2232 Localized swelling, mass and lump, left upper limb: Secondary | ICD-10-CM

## 2018-06-11 NOTE — Patient Instructions (Signed)
Gave patient hand-out, Women Staying Healthy, Active and Well from [facility], with education on breast health, pap smears, heart and colon health. 

## 2018-06-11 NOTE — Progress Notes (Signed)
  Subjective:     Patient ID: Shannon Alvarado, female   DOB: 07-17-1996, 22 y.o.   MRN: [id number]  HPI   Review of Systems     Objective:   Physical Exam  Pulmonary/Chest: Right breast exhibits no inverted nipple, no mass, no nipple discharge, no skin change and no tenderness. Left breast exhibits mass. Left breast exhibits no inverted nipple, no nipple discharge, no skin change and no tenderness.         Assessment:     22 year old White female presents to [facility] with her mom today with complaints of a left axillary mass.  States it has been present for about 3 weeks.  States it tender to palpation.  She thinks it is getting smaller since she stopped wearing her deodorant.  Significant family history of cancer.  Her mom had ovarian cancer at age 23, maternal aunt with breast cancer in her 340's, maternal grandmother had unk cancer, and her maternal grandfather had liver cancer.  On clinical breast exam I can palpate a very tender superficial 0.5 cm nodule in the left axilla.  No breast mass, nipple discharge, skin changes or lymphadenopathy.  Taught self breast awareness.  Did review the possibility of benign findings since the nodule is getting smaller at this time.  Patient has been screened for eligibility.  She does not have any insurance, Medicare or Medicaid.  She also meets financial eligibility.  Hand-out given on the Affordable Care Act.    Plan:     Discussed need for possible ultrasound of the axilla.  Patient has been scheduled to see Dr. Lemar LivingsByrnett on 07/03/18 at 3:30.  Discussed with mom the need for possible genetic testing of herself.  Handout given to her for [facility].  Will follow-up per [facility] protocol.

## 2018-06-25 ENCOUNTER — Other Ambulatory Visit: Payer: Self-pay | Admitting: Obstetrics and Gynecology

## 2018-06-25 ENCOUNTER — Telehealth: Payer: Self-pay | Admitting: Obstetrics and Gynecology
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
## 2018-06-25 NOTE — Telephone Encounter (Signed)
Called both # na, or vm, rx was sent in for pt

## 2018-06-25 NOTE — Telephone Encounter (Signed)
Noted, refills have been sent in

## 2018-07-03 ENCOUNTER — Ambulatory Visit: Payer: Self-pay | Admitting: General Surgery

## 2018-08-07 ENCOUNTER — Ambulatory Visit: Payer: Self-pay | Admitting: General Surgery

## 2018-08-28 ENCOUNTER — Encounter: Payer: Self-pay | Admitting: *Deleted

## 2018-08-28 NOTE — Progress Notes (Signed)
Tried to call patient, but no answer and no voicemail is set up.  Patient has cancelled or no-showed for 2 appointments with Dr. Lemar Livings.  Will send patient a letter encouraging her to follow-up with Dr. Lemar Livings or call me to reassess the axillary nodule.

## 2018-09-10 ENCOUNTER — Telehealth: Payer: Self-pay | Admitting: Obstetrics and Gynecology

## 2018-09-10 NOTE — Telephone Encounter (Signed)
Patient will need a refill on birth control before her annual appointment. She still has a weeks worth remaining. She is scheduled for her annual on 10/12/2018. [facility] Thanks

## 2018-09-11 ENCOUNTER — Other Ambulatory Visit: Payer: Self-pay | Admitting: *Deleted

## 2018-09-11 MED ORDER — LEVONORGEST-ETH ESTRAD 91-DAY 0.15-0.03 &0.01 MG PO TABS: 1.0000 | ORAL_TABLET | Freq: Every day | ORAL | 0 refills | Status: DC

## 2018-10-09 ENCOUNTER — Encounter: Payer: Self-pay | Admitting: *Deleted

## 2018-10-09 NOTE — Progress Notes (Signed)
Patient has not responded to my letter, nor has she rescheduled her appointment with Dr. Lemar LivingsByrnett.  Will close case as refusal to follow-up.

## 2018-10-12 ENCOUNTER — Encounter: Payer: Self-pay | Admitting: Obstetrics and Gynecology

## 2018-10-12 ENCOUNTER — Ambulatory Visit (INDEPENDENT_AMBULATORY_CARE_PROVIDER_SITE_OTHER): Payer: Medicaid Other | Admitting: Obstetrics and Gynecology

## 2018-10-12 VITALS — BP 123/79 | HR 71 | Ht 61.0 in | Wt 195.1 lb

## 2018-10-12 DIAGNOSIS — Z0000 Encounter for general adult medical examination without abnormal findings: Secondary | ICD-10-CM | POA: Diagnosis not present

## 2018-10-12 DIAGNOSIS — Z01419 Encounter for gynecological examination (general) (routine) without abnormal findings: Secondary | ICD-10-CM

## 2018-10-12 MED ORDER — LEVONORGEST-ETH ESTRAD 91-DAY 0.15-0.03 &0.01 MG PO TABS: 1.0000 | ORAL_TABLET | Freq: Every day | ORAL | 4 refills | Status: DC

## 2018-10-12 NOTE — Progress Notes (Signed)
  Subjective:     Shannon Alvarado is a single white 22 y.o. female and is here for a comprehensive physical exam.  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 The patient reports no problems.  Social History   Socioeconomic History  . Marital status: Single    Spouse name: Not on file  . Number of children: Not on file  . Years of education: Not on file  . Highest education level: Not on file  Occupational History  . Not on file  Social Needs  . Financial resource strain: Not on file  . Food insecurity:    Worry: Not on file    Inability: Not on file  . Transportation needs:    Medical: Not on file    Non-medical: Not on file  Tobacco Use  . Smoking status: Never Smoker  . Smokeless tobacco: Never Used  Substance and Sexual Activity  . Alcohol use: No  . Drug use: No  . Sexual activity: Yes    Birth control/protection: Pill  Lifestyle  . Physical activity:    Days per week: Not on file    Minutes per session: Not on file  . Stress: Not on file  Relationships  . Social connections:    Talks on phone: Not on file    Gets together: Not on file    Attends religious service: Not on file    Active member of club or organization: Not on file    Attends meetings of clubs or organizations: Not on file    Relationship status: Not on file  . Intimate partner violence:    Fear of current or ex partner: Not on file    Emotionally abused: Not on file    Physically abused: Not on file    Forced sexual activity: Not on file  Other Topics Concern  . Not on file  Social History Narrative  . Not on file   Health Maintenance  Topic Date Due  . HIV Screening  06/27/2010  . TETANUS/TDAP  06/27/2014  . PAP-Cervical Cytology Screening  06/27/2016  . INFLUENZA VACCINE  01/29/2019 (Originally 05/31/2018)  . PAP SMEAR-Modifier  09/10/2019    The following portions of the patient's history were reviewed and updated as appropriate: allergies, current  medications, past family history, past medical history, past social history, past surgical history and problem list.  Review of Systems A comprehensive review of systems was negative.   Objective:    General appearance: alert, cooperative and appears stated age Neck: no adenopathy, no carotid bruit, no JVD, supple, symmetrical, trachea midline and thyroid not enlarged, symmetric, no tenderness/mass/nodules Lungs: clear to auscultation bilaterally Breasts: normal appearance, no masses or tenderness Heart: regular rate and rhythm, S1, S2 normal, no murmur, click, rub or gallop Abdomen: soft, non-tender; bowel sounds normal; no masses,  no organomegaly Pelvic: cervix normal in appearance, external genitalia normal, no adnexal masses or tenderness, no cervical motion tenderness, rectovaginal septum normal, uterus normal size, shape, and consistency and vagina normal without discharge    Assessment:    Healthy female exam. Obesity, OCP user     Plan:  RTC 1 year or as needed.   Janelys Glassner,CNM   See After Visit Summary for Counseling Recommendations

## 2018-10-12 NOTE — Patient Instructions (Signed)
Preventive Care 18-39 Years, Female Preventive care refers to lifestyle choices and visits with your health care provider that can promote health and wellness. What does preventive care include?  A yearly physical exam. This is also called an annual well check.  Dental exams once or twice a year.  Routine eye exams. Ask your health care provider how often you should have your eyes checked.  Personal lifestyle choices, including: ? Daily care of your teeth and gums. ? Regular physical activity. ? Eating a healthy diet. ? Avoiding tobacco and drug use. ? Limiting alcohol use. ? Practicing safe sex. ? Taking vitamin and mineral supplements as recommended by your health care provider. What happens during an annual well check? The services and screenings done by your health care provider during your annual well check will depend on your age, overall health, lifestyle risk factors, and family history of disease. Counseling Your health care provider may ask you questions about your:  Alcohol use.  Tobacco use.  Drug use.  Emotional well-being.  Home and relationship well-being.  Sexual activity.  Eating habits.  Work and work Statistician.  Method of birth control.  Menstrual cycle.  Pregnancy history.  Screening You may have the following tests or measurements:  Height, weight, and BMI.  Diabetes screening. This is done by checking your blood sugar (glucose) after you have not eaten for a while (fasting).  Blood pressure.  Lipid and cholesterol levels. These may be checked every 5 years starting at age 38.  Skin check.  Hepatitis C blood test.  Hepatitis B blood test.  Sexually transmitted disease (STD) testing.  BRCA-related cancer screening. This may be done if you have a family history of breast, ovarian, tubal, or peritoneal cancers.  Pelvic exam and Pap test. This may be done every 3 years starting at age 38. Starting at age 30, this may be done  every 5 years if you have a Pap test in combination with an HPV test.  Discuss your test results, treatment options, and if necessary, the need for more tests with your health care provider. Vaccines Your health care provider may recommend certain vaccines, such as:  Influenza vaccine. This is recommended every year.  Tetanus, diphtheria, and acellular pertussis (Tdap, Td) vaccine. You may need a Td booster every 10 years.  Varicella vaccine. You may need this if you have not been vaccinated.  HPV vaccine. If you are 39 or younger, you may need three doses over 6 months.  Measles, mumps, and rubella (MMR) vaccine. You may need at least one dose of MMR. You may also need a second dose.  Pneumococcal 13-valent conjugate (PCV13) vaccine. You may need this if you have certain conditions and were not previously vaccinated.  Pneumococcal polysaccharide (PPSV23) vaccine. You may need one or two doses if you smoke cigarettes or if you have certain conditions.  Meningococcal vaccine. One dose is recommended if you are age 68-21 years and a first-year college student living in a residence hall, or if you have one of several medical conditions. You may also need additional booster doses.  Hepatitis A vaccine. You may need this if you have certain conditions or if you travel or work in places where you may be exposed to hepatitis A.  Hepatitis B vaccine. You may need this if you have certain conditions or if you travel or work in places where you may be exposed to hepatitis B.  Haemophilus influenzae type b (Hib) vaccine. You may need this  if you have certain risk factors.  Talk to your health care provider about which screenings and vaccines you need and how often you need them. This information is not intended to replace advice given to you by your health care provider. Make sure you discuss any questions you have with your health care provider. Document Released: 12/13/2001 Document Revised:  07/06/2016 Document Reviewed: 08/18/2015 Elsevier Interactive Patient Education  2018 Elsevier Inc.  

## 2018-10-17 LAB — PAP IG AND CT-NG NAA
CHLAMYDIA, NUC. ACID AMP: NEGATIVE
Gonococcus by Nucleic Acid Amp: NEGATIVE

## 2019-06-11 ENCOUNTER — Ambulatory Visit: Payer: Self-pay | Attending: Oncology

## 2019-06-11 ENCOUNTER — Ambulatory Visit
Admission: RE | Admit: 2019-06-11 | Discharge: 2019-06-11 | Disposition: A | Discharge status: Home / Self Care | Payer: Self-pay | Source: Ambulatory Visit | Attending: Oncology | Admitting: Oncology

## 2019-06-11 ENCOUNTER — Ambulatory Visit: Payer: Self-pay | Admitting: Surgery

## 2019-06-11 ENCOUNTER — Other Ambulatory Visit: Payer: Self-pay

## 2019-06-11 VITALS — BP 129/96 | HR 71 | Temp 96.4°F | Ht 62.4 in | Wt 192.7 lb

## 2019-06-11 DIAGNOSIS — N63 Unspecified lump in unspecified breast: Secondary | ICD-10-CM | POA: Insufficient documentation

## 2019-06-11 NOTE — Progress Notes (Signed)
  Subjective:     Patient ID: Shannon Alvarado, female   DOB: Sep 13, 1996, 23 y.o.   MRN: [id number]  HPI   Review of Systems     Objective:   Physical Exam Chest:     Breasts:        Right: No swelling, bleeding, inverted nipple, mass, nipple discharge, skin change or tenderness.        Left: No swelling, bleeding, inverted nipple, mass, nipple discharge, skin change or tenderness.       Comments: Left axillary mass; left axillary rash Lymphadenopathy:     Upper Body:     Left upper body: Axillary adenopathy present.        Assessment:     23 year old patient returns with complaint of tender left axillary mass. She was seen in [facility] for same complainit in 2019, but did not go for recommended follow-up. Patient screened, and meets [facility] eligibility.  Patient does not have insurance, Medicare or Medicaid. Instructed patient on breast self awareness using teach back method.  Palpable 3 cm left axillary mass with erythematous rash in axillary crease. Patient reports when it is painful she presses on lower axilla and it bleeds.    Plan:     Sent for Ultrasound of left breast, axilla

## 2019-07-08 NOTE — Progress Notes (Signed)
Phoned patient with benign findings.  Radilogist also discussed with patient.  Patient is going to follow-up with provider at [facility]. Copy to [facility].

## 2019-07-26 ENCOUNTER — Other Ambulatory Visit: Payer: Self-pay

## 2019-07-26 ENCOUNTER — Other Ambulatory Visit: Payer: Self-pay | Admitting: Internal Medicine

## 2019-07-26 DIAGNOSIS — Z20822 Contact with and (suspected) exposure to covid-19: Secondary | ICD-10-CM

## 2019-07-27 LAB — NOVEL CORONAVIRUS, NAA: SARS-CoV-2, NAA: NOT DETECTED

## 2019-08-02 ENCOUNTER — Other Ambulatory Visit: Payer: Self-pay

## 2019-08-02 DIAGNOSIS — Z20822 Contact with and (suspected) exposure to covid-19: Secondary | ICD-10-CM

## 2019-08-03 LAB — NOVEL CORONAVIRUS, NAA: SARS-CoV-2, NAA: NOT DETECTED

## 2019-08-21 ENCOUNTER — Other Ambulatory Visit: Payer: Self-pay | Admitting: *Deleted

## 2019-08-21 DIAGNOSIS — Z20822 Contact with and (suspected) exposure to covid-19: Secondary | ICD-10-CM

## 2019-08-22 LAB — NOVEL CORONAVIRUS, NAA: SARS-CoV-2, NAA: NOT DETECTED

## 2019-09-16 ENCOUNTER — Other Ambulatory Visit: Payer: Self-pay

## 2019-09-16 DIAGNOSIS — Z20822 Contact with and (suspected) exposure to covid-19: Secondary | ICD-10-CM

## 2019-09-18 LAB — NOVEL CORONAVIRUS, NAA: SARS-CoV-2, NAA: NOT DETECTED

## 2019-10-15 ENCOUNTER — Encounter: Payer: Medicaid Other | Admitting: Obstetrics and Gynecology

## 2020-01-21 ENCOUNTER — Ambulatory Visit
Admission: EM | Admit: 2020-01-21 | Discharge: 2020-01-21 | Disposition: A | Discharge status: Home / Self Care | Payer: Self-pay | Attending: Emergency Medicine | Admitting: Emergency Medicine

## 2020-01-21 ENCOUNTER — Encounter: Payer: Self-pay | Admitting: Emergency Medicine

## 2020-01-21 ENCOUNTER — Other Ambulatory Visit: Payer: Self-pay

## 2020-01-21 DIAGNOSIS — R0602 Shortness of breath: Secondary | ICD-10-CM

## 2020-01-21 DIAGNOSIS — R197 Diarrhea, unspecified: Secondary | ICD-10-CM

## 2020-01-21 DIAGNOSIS — B349 Viral infection, unspecified: Secondary | ICD-10-CM

## 2020-01-21 DIAGNOSIS — R112 Nausea with vomiting, unspecified: Secondary | ICD-10-CM

## 2020-01-21 DIAGNOSIS — R05 Cough: Secondary | ICD-10-CM

## 2020-01-21 DIAGNOSIS — R519 Headache, unspecified: Secondary | ICD-10-CM

## 2020-01-21 MED ORDER — ONDANSETRON HCL 4 MG PO TABS: 4.0000 mg | ORAL_TABLET | Freq: Four times a day (QID) | ORAL | 0 refills | Status: DC | PRN

## 2020-01-21 NOTE — ED Provider Notes (Signed)
Shannon Alvarado    CSN: [id number] Arrival date & time: 01/21/20  1303      History   Chief Complaint Chief Complaint  Patient presents with  . Headache  . Emesis    HPI Shannon Alvarado is a 24 y.o. female.   Patient presents with 2-week history of nonproductive cough.  She also reports 3-4 day history of headache, shortness of breath, diarrhea, nausea, and emesis.  No emesis since yesterday; No diarrhea in the past 2 days.  She is able to drink clear liquids without difficulty.  She denies fever, chills, sore throat, abdominal pain, rash, or other symptoms.  She has attempted treatment at home with DayQuil and TheraFlu.  She also has been using her mother's albuterol inhaler.  The history is provided by the patient.    Past Medical History:  Diagnosis Date  . Asthma   . Irregular periods   . Obesity     Patient Active Problem List   Diagnosis Date Noted  . Obesity 06/24/2015  . OCP (oral contraceptive pills) initiation 06/24/2015    History reviewed. No pertinent surgical history.  OB History   No obstetric history on file.      Home Medications    Prior to Admission medications   Medication Sig Start Date End Date Taking? Authorizing Provider  GENERESS FE 0.8-25 MG-MCG tablet TAKE ONE TABLET BY MOUTH EVERY DAY Patient not taking: Reported on 10/12/2018 11/21/17   Shambley, Melody N, CNM  ibuprofen (ADVIL,MOTRIN) 600 MG tablet Take 1 tablet (600 mg total) by mouth every 8 (eight) hours as needed. Patient not taking: Reported on 09/19/2017 01/05/17   Tommi Rumps, PA-C  Levonorgestrel-Ethinyl Estradiol (AMETHIA,CAMRESE) 0.15-0.03 &0.01 MG tablet Take 1 tablet by mouth daily. 10/12/18   Shambley, Melody N, CNM  ondansetron (ZOFRAN) 4 MG tablet Take 1 tablet (4 mg total) by mouth every 6 (six) hours as needed for nausea or vomiting. 01/21/20   Mickie Bail, NP  predniSONE (DELTASONE) 50 MG tablet Take one 50 mg tablet once daily for the next 5  days. Patient not taking: Reported on 10/12/2018 03/07/18   Orvil Feil, PA-C    Family History Family History  Problem Relation Age of Onset  . Cancer Mother        ovarian  . Ovarian cancer Mother   . Cancer Maternal Aunt        breast  . Breast cancer Maternal Aunt     Social History Social History   Tobacco Use  . Smoking status: Never Smoker  . Smokeless tobacco: Never Used  Substance Use Topics  . Alcohol use: No  . Drug use: No     Allergies   Patient has no known allergies.   Review of Systems Review of Systems  Constitutional: Negative for chills and fever.  HENT: Negative for ear pain and sore throat.   Eyes: Negative for pain and visual disturbance.  Respiratory: Positive for cough and shortness of breath.   Cardiovascular: Negative for chest pain and palpitations.  Gastrointestinal: Positive for diarrhea, nausea and vomiting. Negative for abdominal pain.  Genitourinary: Negative for dysuria and hematuria.  Musculoskeletal: Negative for arthralgias and back pain.  Skin: Negative for color change and rash.  Neurological: Positive for headaches. Negative for seizures and syncope.  All other systems reviewed and are negative.    Physical Exam Triage Vital Signs ED Triage Vitals  Enc Vitals Group     BP  Pulse      Resp      Temp      Temp src      SpO2      Weight      Height      Head Circumference      Peak Flow      Pain Score      Pain Loc      Pain Edu?      Excl. in GC?    No data found.  Updated Vital Signs BP 120/86 (BP Location: Left Arm)   Pulse 72   Temp 99.1 F (37.3 C)   Resp 18   Wt 190 lb (86.2 kg)   LMP 01/19/2020   SpO2 99%   BMI 34.31 kg/m   Visual Acuity Right Eye Distance:   Left Eye Distance:   Bilateral Distance:    Right Eye Near:   Left Eye Near:    Bilateral Near:     Physical Exam Vitals and nursing note reviewed.  Constitutional:      General: She is not in acute distress.     Appearance: She is well-developed. She is not ill-appearing.  HENT:     Head: Normocephalic and atraumatic.     Right Ear: Tympanic membrane normal.     Left Ear: Tympanic membrane normal.     Nose: Nose normal.     Mouth/Throat:     Mouth: Mucous membranes are moist.     Pharynx: Oropharynx is clear.  Eyes:     Conjunctiva/sclera: Conjunctivae normal.  Cardiovascular:     Rate and Rhythm: Normal rate and regular rhythm.     Heart sounds: No murmur.  Pulmonary:     Effort: Pulmonary effort is normal. No respiratory distress.     Breath sounds: Normal breath sounds. No wheezing or rhonchi.  Abdominal:     General: Bowel sounds are normal.     Palpations: Abdomen is soft.     Tenderness: There is no abdominal tenderness. There is no guarding or rebound.  Musculoskeletal:     Cervical back: Neck supple.  Skin:    General: Skin is warm and dry.     Findings: No rash.  Neurological:     General: No focal deficit present.     Mental Status: She is alert and oriented to person, place, and time.  Psychiatric:        Mood and Affect: Mood normal.        Behavior: Behavior normal.      UC Treatments / Results  Labs (all labs ordered are listed, but only abnormal results are displayed) Labs Reviewed  NOVEL CORONAVIRUS, NAA    EKG   Radiology No results found.  Procedures Procedures (including critical care time)  Medications Ordered in UC Medications - No data to display  Initial Impression / Assessment and Plan / UC Course  I have reviewed the triage vital signs and the nursing notes.  Pertinent labs & imaging results that were available during my care of the patient were reviewed by me and considered in my medical decision making (see chart for details).   Viral Illness.  Treating n/v with Zofran.  Discussed staying hydrated with clear liquids.  Directed patient to go to the ED if she has severe vomiting or diarrhea; or develops new symptoms such as fever or  abdominal pain.  COVID test performed here.  Instructed patient to self quarantine until the test result is back.  Discussed with  patient that she can take Tylenol as needed for fever or discomfort.  Instructed patient to go to the emergency department if she develops high fever, shortness of breath, or other concerning symptoms.  Patient agrees with plan of care.     Final Clinical Impressions(s) / UC Diagnoses   Final diagnoses:  Viral illness     Discharge Instructions     Take the antinausea medication as directed.    Keep yourself hydrated with clear liquids, such as water, Gatorade, Pedialyte, Sprite, or ginger ale.    Go to the emergency department if you have severe vomiting or diarrhea; Or if you develop new symptoms such as fever or abdominal pain.    Your COVID test is pending.  You should self quarantine until the test result is back.    Take Tylenol as needed for fever or discomfort.  Rest and keep yourself hydrated.    Go to the emergency department if you develop shortness of breath, severe diarrhea, high fever not relieved by Tylenol or ibuprofen, or other concerning symptoms.       ED Prescriptions    Medication Sig Dispense Auth. Provider   ondansetron (ZOFRAN) 4 MG tablet Take 1 tablet (4 mg total) by mouth every 6 (six) hours as needed for nausea or vomiting. 12 tablet Sharion Balloon, NP     I have reviewed the PDMP during this encounter.   Sharion Balloon, NP 01/21/20 1339

## 2020-01-21 NOTE — Discharge Instructions (Addendum)
Take the antinausea medication as directed.    Keep yourself hydrated with clear liquids, such as water, Gatorade, Pedialyte, Sprite, or ginger ale.    Go to the emergency department if you have severe vomiting or diarrhea; Or if you develop new symptoms such as fever or abdominal pain.    Your COVID test is pending.  You should self quarantine until the test result is back.    Take Tylenol as needed for fever or discomfort.  Rest and keep yourself hydrated.    Go to the emergency department if you develop shortness of breath, severe diarrhea, high fever not relieved by Tylenol or ibuprofen, or other concerning symptoms.

## 2020-01-21 NOTE — ED Triage Notes (Addendum)
Patient in office today c/o headache,vomiting, some sob starting on 01/14/20 but got worse yesterday started vomiting. Headache x 3-4d ago  Patient has been using mom's inhaler  OTC: Dayquil/Theraflu

## 2020-01-22 LAB — NOVEL CORONAVIRUS, NAA: SARS-CoV-2, NAA: NOT DETECTED

## 2020-01-22 LAB — SARS-COV-2, NAA 2 DAY TAT

## 2020-09-04 ENCOUNTER — Emergency Department
Admission: EM | Admit: 2020-09-04 | Discharge: 2020-09-04 | Disposition: A | Discharge status: Home / Self Care | Payer: Medicaid Other | Attending: Emergency Medicine | Admitting: Emergency Medicine

## 2020-09-04 ENCOUNTER — Other Ambulatory Visit: Payer: Self-pay

## 2020-09-04 DIAGNOSIS — J45909 Unspecified asthma, uncomplicated: Secondary | ICD-10-CM | POA: Insufficient documentation

## 2020-09-04 DIAGNOSIS — S01512A Laceration without foreign body of oral cavity, initial encounter: Secondary | ICD-10-CM | POA: Insufficient documentation

## 2020-09-04 DIAGNOSIS — W503XXA Accidental bite by another person, initial encounter: Secondary | ICD-10-CM | POA: Insufficient documentation

## 2020-09-04 DIAGNOSIS — S01511A Laceration without foreign body of lip, initial encounter: Secondary | ICD-10-CM

## 2020-09-04 MED ORDER — LIDOCAINE VISCOUS HCL 2 % MT SOLN
15.0000 mL | Freq: Once | OROMUCOSAL | Status: AC
  Administered 2020-09-04: 15 mL via OROMUCOSAL
  Filled 2020-09-04: qty 15

## 2020-09-04 MED ORDER — IBUPROFEN 800 MG PO TABS
800.0000 mg | ORAL_TABLET | Freq: Once | ORAL | Status: AC
  Administered 2020-09-04: 800 mg via ORAL
  Filled 2020-09-04: qty 1

## 2020-09-04 MED ORDER — LIDOCAINE VISCOUS HCL 2 % MT SOLN: OROMUCOSAL | 0 refills | Status: DC

## 2020-09-04 NOTE — ED Triage Notes (Signed)
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

## 2020-09-04 NOTE — ED Provider Notes (Signed)
[facility] EMERGENCY DEPARTMENT Provider Note   CSN: [id number] Arrival date & time: 09/04/20  1614     History Chief Complaint  Patient presents with  . Laceration    Shannon Alvarado is a 24 y.o. female presents to the emergency department for evaluation of intraoral laceration.  Patient states the frenulum under the tongue was cut has her toddler stuck his finger into her mouth.  She had a lot of bleeding initially, this is subsided.  Her tetanus is up-to-date.  She is having some moderate discomfort under the tongue.  No difficulty swallowing or talking.  She denies any other injuries.  HPI     Past Medical History:  Diagnosis Date  . Asthma   . Irregular periods   . Obesity     Patient Active Problem List   Diagnosis Date Noted  . Obesity 06/24/2015  . OCP (oral contraceptive pills) initiation 06/24/2015    History reviewed. No pertinent surgical history.   OB History   No obstetric history on file.     Family History  Problem Relation Age of Onset  . Cancer Mother        ovarian  . Ovarian cancer Mother   . Cancer Maternal Aunt        breast  . Breast cancer Maternal Aunt     Social History   Tobacco Use  . Smoking status: Never Smoker  . Smokeless tobacco: Never Used  Vaping Use  . Vaping Use: Never used  Substance Use Topics  . Alcohol use: No  . Drug use: No    Home Medications Prior to Admission medications   Medication Sig Start Date End Date Taking? Authorizing Provider  GENERESS FE 0.8-25 MG-MCG tablet TAKE ONE TABLET BY MOUTH EVERY DAY Patient not taking: Reported on 10/12/2018 11/21/17   Shambley, Melody N, CNM  ibuprofen (ADVIL,MOTRIN) 600 MG tablet Take 1 tablet (600 mg total) by mouth every 8 (eight) hours as needed. Patient not taking: Reported on 09/19/2017 01/05/17   Tommi Rumps, PA-C  Levonorgestrel-Ethinyl Estradiol (AMETHIA,CAMRESE) 0.15-0.03 &0.01 MG tablet Take 1 tablet by mouth daily. 10/12/18    Shambley, Melody N, CNM  lidocaine (XYLOCAINE) 2 % solution Apply small amount with cottonball to oral laceration site 4 times daily as needed 09/04/20   Evon Slack, PA-C  ondansetron (ZOFRAN) 4 MG tablet Take 1 tablet (4 mg total) by mouth every 6 (six) hours as needed for nausea or vomiting. 01/21/20   Mickie Bail, NP  predniSONE (DELTASONE) 50 MG tablet Take one 50 mg tablet once daily for the next 5 days. Patient not taking: Reported on 10/12/2018 03/07/18   Orvil Feil, PA-C    Allergies    Patient has no known allergies.  Review of Systems   Review of Systems  Constitutional: Negative for fever.  HENT: Negative for trouble swallowing.   Respiratory: Negative for shortness of breath.   Cardiovascular: Negative for chest pain.  Gastrointestinal: Negative for nausea and vomiting.    Physical Exam Updated Vital Signs BP 125/75 (BP Location: Left Arm)   Pulse 64   Temp 98.7 F (37.1 C) (Oral)   Resp 16   Ht 5\' 2"  (1.575 m)   Wt 81.6 kg   LMP 08/27/2020 (Approximate)   SpO2 100%   BMI 32.92 kg/m   Physical Exam Constitutional:      Appearance: She is well-developed.  HENT:     Head: Normocephalic and atraumatic.  Comments: Under the tongue along the frenulum attachment to the base of the tongue there is a small tear, a few millimeters with no active bleeding.  Skin is tender to palpation.  No other intraoral injury.  No signs of infection.    Nose: Nose normal.  Eyes:     Conjunctiva/sclera: Conjunctivae normal.  Cardiovascular:     Rate and Rhythm: Normal rate.  Pulmonary:     Effort: Pulmonary effort is normal. No respiratory distress.  Musculoskeletal:        General: Normal range of motion.     Cervical back: Normal range of motion.  Skin:    General: Skin is warm.     Findings: No rash.  Neurological:     Mental Status: She is alert and oriented to person, place, and time.  Psychiatric:        Behavior: Behavior normal.        Thought Content:  Thought content normal.     ED Results / Procedures / Treatments   Labs (all labs ordered are listed, but only abnormal results are displayed) Labs Reviewed - No data to display  EKG None  Radiology No results found.  Procedures Procedures (including critical care time)  Medications Ordered in ED Medications  lidocaine (XYLOCAINE) 2 % viscous mouth solution 15 mL (15 mLs Mouth/Throat Given 09/04/20 1651)  ibuprofen (ADVIL) tablet 800 mg (800 mg Oral Given 09/04/20 1651)    ED Course  I have reviewed the triage vital signs and the nursing notes.  Pertinent labs & imaging results that were available during my care of the patient were reviewed by me and considered in my medical decision making (see chart for details).    MDM Rules/Calculators/A&P                          24 year old female with very small superficial tear to the attachment of the frenulum to the tongue.  Tears only a few millimeters in size, no signs of infection.  Tetanus is up-to-date.  Patient will take Tylenol and ibuprofen as needed, we discussed not needing oral antibiotics.  She understands signs symptoms return to the ER for. Final Clinical Impression(s) / ED Diagnoses Final diagnoses:  Laceration of upper frenulum, initial encounter    Rx / DC Orders ED Discharge Orders         Ordered    lidocaine (XYLOCAINE) 2 % solution        09/04/20 1652           Evon Slack, PA-C 09/04/20 1700    Phineas Semen, MD 09/04/20 1725

## 2020-09-04 NOTE — Discharge Instructions (Addendum)
Please alternate Tylenol and ibuprofen as needed for pain.  You may use topical viscous lidocaine as needed to help numb the area.  Return to the ER for any difficulty swallowing, worsening symptoms or urgent changes in health.

## 2020-11-01 ENCOUNTER — Emergency Department
Admission: EM | Admit: 2020-11-01 | Discharge: 2020-11-01 | Disposition: A | Discharge status: Home / Self Care | Payer: HRSA Program | Attending: Emergency Medicine | Admitting: Emergency Medicine

## 2020-11-01 ENCOUNTER — Encounter: Payer: Self-pay | Admitting: Emergency Medicine

## 2020-11-01 ENCOUNTER — Emergency Department: Payer: HRSA Program

## 2020-11-01 ENCOUNTER — Other Ambulatory Visit: Payer: Self-pay

## 2020-11-01 DIAGNOSIS — R0602 Shortness of breath: Secondary | ICD-10-CM | POA: Diagnosis present

## 2020-11-01 DIAGNOSIS — Z20822 Contact with and (suspected) exposure to covid-19: Secondary | ICD-10-CM

## 2020-11-01 DIAGNOSIS — J45909 Unspecified asthma, uncomplicated: Secondary | ICD-10-CM | POA: Insufficient documentation

## 2020-11-01 DIAGNOSIS — U071 COVID-19: Secondary | ICD-10-CM | POA: Diagnosis not present

## 2020-11-01 DIAGNOSIS — H66001 Acute suppurative otitis media without spontaneous rupture of ear drum, right ear: Secondary | ICD-10-CM | POA: Diagnosis not present

## 2020-11-01 LAB — POC SARS CORONAVIRUS 2 AG -  ED: SARS Coronavirus 2 Ag: NEGATIVE

## 2020-11-01 MED ORDER — AMOXICILLIN 500 MG PO TABS: 500.0000 mg | ORAL_TABLET | Freq: Three times a day (TID) | ORAL | 0 refills | Status: AC

## 2020-11-01 MED ORDER — BENZONATATE 100 MG PO CAPS: 100.0000 mg | ORAL_CAPSULE | Freq: Three times a day (TID) | ORAL | 0 refills | Status: DC | PRN

## 2020-11-01 MED ORDER — ALBUTEROL SULFATE HFA 108 (90 BASE) MCG/ACT IN AERS: 2.0000 | INHALATION_SPRAY | Freq: Four times a day (QID) | RESPIRATORY_TRACT | 2 refills | Status: AC | PRN

## 2020-11-01 NOTE — ED Notes (Signed)
Pt has c/o fever, cough, chills, body aches x the past few days. Pt endorses being in close contact with someone that is Covid+. Pt in no acute respiratory distress at this time. Pt ambulatory and A&O x 4 on arrival. Awaiting further orders. Will continue to monitor.

## 2020-11-01 NOTE — ED Provider Notes (Signed)
[facility] Emergency Department Provider Note  ____________________________________________   Event Date/Time   First MD Initiated Contact with Patient 11/01/20 1505     (approximate)  I have reviewed the triage vital signs and the nursing notes.   HISTORY  Chief Complaint Shortness of Breath, Cough, Fever, and Generalized Body Aches  HPI Shannon Alvarado is a 25 y.o. female who presents to the emergency department for evaluation of shortness of breath, cough, fever and generalized body aches of been present for the last 3 days.  She states that around the same time, she also had acute onset of right ear pain.  She states that her shortness of breath feels similar to her asthma exacerbations, she has not used any asthma medications.  She denies any dyspnea at rest, states that is only when she tries to exert herself.  She denies any chest pain associated with this.  She also complains of a nonproductive cough with this.  She was exposed to her friend's daughter who tested positive for Covid.  She has not been vaccinated for flu or Covid.         Past Medical History:  Diagnosis Date  . Asthma   . Irregular periods   . Obesity     Patient Active Problem List   Diagnosis Date Noted  . Obesity 06/24/2015  . OCP (oral contraceptive pills) initiation 06/24/2015    History reviewed. No pertinent surgical history.  Prior to Admission medications   Medication Sig Start Date End Date Taking? Authorizing Provider  albuterol (VENTOLIN HFA) 108 (90 Base) MCG/ACT inhaler Inhale 2 puffs into the lungs every 6 (six) hours as needed for wheezing or shortness of breath. 11/01/20  Yes Marlana Salvage, PA  amoxicillin (AMOXIL) 500 MG tablet Take 1 tablet (500 mg total) by mouth in the morning, at noon, and at bedtime for 10 days. 11/01/20 11/11/20 Yes Stein Windhorst, Farrel Gordon, PA  benzonatate (TESSALON PERLES) 100 MG capsule Take 1 capsule (100 mg total) by mouth 3 (three)  times daily as needed for cough. 11/01/20 11/01/21 Yes Lovena Kluck, Farrel Gordon, PA  GENERESS FE 0.8-25 MG-MCG tablet TAKE ONE TABLET BY MOUTH EVERY DAY Patient not taking: Reported on 10/12/2018 11/21/17   Shambley, Melody N, CNM  ibuprofen (ADVIL,MOTRIN) 600 MG tablet Take 1 tablet (600 mg total) by mouth every 8 (eight) hours as needed. Patient not taking: Reported on 09/19/2017 01/05/17   Johnn Hai, PA-C  Levonorgestrel-Ethinyl Estradiol (AMETHIA,CAMRESE) 0.15-0.03 &0.01 MG tablet Take 1 tablet by mouth daily. 10/12/18   Shambley, Melody N, CNM  lidocaine (XYLOCAINE) 2 % solution Apply small amount with cottonball to oral laceration site 4 times daily as needed 09/04/20   Duanne Guess, PA-C  ondansetron (ZOFRAN) 4 MG tablet Take 1 tablet (4 mg total) by mouth every 6 (six) hours as needed for nausea or vomiting. 01/21/20   Sharion Balloon, NP  predniSONE (DELTASONE) 50 MG tablet Take one 50 mg tablet once daily for the next 5 days. Patient not taking: Reported on 10/12/2018 03/07/18   Lannie Fields, PA-C    Allergies Patient has no known allergies.  Family History  Problem Relation Age of Onset  . Cancer Mother        ovarian  . Ovarian cancer Mother   . Cancer Maternal Aunt        breast  . Breast cancer Maternal Aunt     Social History Social History   Tobacco Use  .  Smoking status: Never Smoker  . Smokeless tobacco: Never Used  Vaping Use  . Vaping Use: Never used  Substance Use Topics  . Alcohol use: No  . Drug use: No    Review of Systems Constitutional: + fever/chills, + generalized body aches Eyes: No visual changes. ENT: + Right ear pain, no sore throat. Cardiovascular: Denies chest pain. Respiratory: + Cough, + shortness of breath. Gastrointestinal: No abdominal pain.  No nausea, no vomiting.  No diarrhea.  No constipation. Genitourinary: Negative for dysuria. Musculoskeletal: Negative for back pain. Skin: Negative for rash. Neurological: Negative for  headaches, focal weakness or numbness. ____________________________________________   PHYSICAL EXAM:  VITAL SIGNS: ED Triage Vitals [11/01/20 1354]  Enc Vitals Group     BP 114/77     Pulse Rate 70     Resp 20     Temp 98.9 F (37.2 C)     Temp Source Oral     SpO2 98 %     Weight 180 lb (81.6 kg)     Height 5\' 2"  (1.575 m)     Head Circumference      Peak Flow      Pain Score 8     Pain Loc      Pain Edu?      Excl. in GC?     Constitutional: Alert and oriented. Well appearing and in no acute distress. Eyes: Conjunctivae are normal. PERRL. EOMI. Head: Atraumatic. Nose: No congestion/rhinnorhea. Ears: The left TM is visualized pearly gray, no bulging or erythema.  The right TM is bulging and erythematous Mouth/Throat: Mucous membranes are moist.  Oropharynx non-erythematous. Neck: No stridor.   Lymphatic: No cervical lymphadenopathy Cardiovascular: Normal rate, regular rhythm. Grossly normal heart sounds.  Good peripheral circulation. Respiratory: Normal respiratory effort.  No retractions. Lungs with coarse breath sounds throughout, no wheezes, rhonchi or crackles Gastrointestinal: Soft and nontender. No distention. No abdominal bruits. No CVA tenderness. Musculoskeletal: No lower extremity tenderness nor edema.  No joint effusions. Neurologic:  Normal speech and language. No gross focal neurologic deficits are appreciated. No gait instability. Skin:  Skin is warm, dry and intact. No rash noted. Psychiatric: Mood and affect are normal. Speech and behavior are normal.  ____________________________________________   LABS (all labs ordered are listed, but only abnormal results are displayed)  Labs Reviewed  SARS CORONAVIRUS 2 (TAT 6-24 HRS)  POC SARS CORONAVIRUS 2 AG -  ED    ____________________________________________  RADIOLOGY , personally viewed and evaluated these images (plain radiographs) as part of my medical decision making, as well  as reviewing the written report by the radiologist.  ED provider interpretation: No acute pneumonia.  Official radiology report(s): DG Chest Portable 1 View  Result Date: 11/01/2020 CLINICAL DATA:  Shortness of breath, cough, fever. EXAM: PORTABLE CHEST 1 VIEW COMPARISON:  None. FINDINGS: The heart size and mediastinal contours are within normal limits. Both lungs are clear. No pneumothorax or pleural effusion is noted. The visualized skeletal structures are unremarkable. IMPRESSION: No active disease. Electronically Signed   By: 12/30/2020 M.D.   On: 11/01/2020 16:24    ____________________________________________   INITIAL IMPRESSION / ASSESSMENT AND PLAN / ED COURSE  As part of my medical decision making, I reviewed the following data within the electronic MEDICAL RECORD NUMBER Nursing notes reviewed and incorporated, Labs reviewed and Radiograph reviewed        Patient is a 25 year old female who presents to the emergency department for evaluation of Covid-like  symptoms after known positive exposure.  See HPI for further details.  On physical exam, the patient does have an erythematous and bulging right TM.  In addition, she has coarse breath sounds appreciated throughout all lung fields, but no wheezes, rhonchi or rales.  The patient's physical exam is otherwise unremarkable.  Rapid antigen test is negative for Covid, however the patient later admits that she took a home Covid test today that was also positive.  Chest x-ray is negative for any focal pneumonia.  Will order a 6 to 24-hour PCR given high suspicion with patient's symptoms.  Discussed symptomatic treatment for her Covid symptoms.  Will recommend alternation of Tylenol and ibuprofen, and will refill the patient's asthma inhalers that she is out of.  In addition, will prescribe Tessalon Perles for her cough.  The patient also appears to have an acute right otitis media for which we will prescribe amoxicillin.  The patient is  amenable with this plan and she will follow up with primary care, or return to the emergency department for any worsening.      ____________________________________________   FINAL CLINICAL IMPRESSION(S) / ED DIAGNOSES  Final diagnoses:  Suspected COVID-19 virus infection  Non-recurrent acute suppurative otitis media of right ear without spontaneous rupture of tympanic membrane     ED Discharge Orders         Ordered    albuterol (VENTOLIN HFA) 108 (90 Base) MCG/ACT inhaler  Every 6 hours PRN        11/01/20 1713    amoxicillin (AMOXIL) 500 MG tablet  3 times daily        11/01/20 1713    benzonatate (TESSALON PERLES) 100 MG capsule  3 times daily PRN        11/01/20 1713          *Please note:  Shannon Alvarado was evaluated in Emergency Department on 11/01/2020 for the symptoms described in the history of present illness. She was evaluated in the context of the global COVID-19 pandemic, which necessitated consideration that the patient might be at risk for infection with the SARS-CoV-2 virus that causes COVID-19. Institutional protocols and algorithms that pertain to the evaluation of patients at risk for COVID-19 are in a state of rapid change based on information released by regulatory bodies including the CDC and federal and state organizations. These policies and algorithms were followed during the patient's care in the ED.  Some ED evaluations and interventions may be delayed as a result of limited staffing during and the pandemic.*   Note:  This document was prepared using Dragon voice recognition software and may include unintentional dictation errors.    Lucy Chris, PA 11/01/20 2259    Merwyn Katos, MD 11/01/20 [id number]

## 2020-11-01 NOTE — ED Triage Notes (Signed)
Pt reports flu-like sx's for 3 days.

## 2020-11-02 LAB — SARS CORONAVIRUS 2 (TAT 6-24 HRS): SARS Coronavirus 2: POSITIVE — AB

## 2020-12-18 ENCOUNTER — Ambulatory Visit (INDEPENDENT_AMBULATORY_CARE_PROVIDER_SITE_OTHER): Payer: Medicaid Other | Admitting: Certified Nurse Midwife

## 2020-12-18 ENCOUNTER — Other Ambulatory Visit: Payer: Self-pay

## 2020-12-18 ENCOUNTER — Encounter: Payer: Self-pay | Admitting: Certified Nurse Midwife

## 2020-12-18 VITALS — BP 108/77 | HR 78 | Ht 62.0 in | Wt 184.6 lb

## 2020-12-18 DIAGNOSIS — N926 Irregular menstruation, unspecified: Secondary | ICD-10-CM | POA: Diagnosis not present

## 2020-12-18 DIAGNOSIS — Z3201 Encounter for pregnancy test, result positive: Secondary | ICD-10-CM | POA: Diagnosis not present

## 2020-12-18 LAB — POCT URINE PREGNANCY: Preg Test, Ur: POSITIVE — AB

## 2020-12-18 NOTE — Patient Instructions (Signed)
Commonly Asked Questions During Pregnancy  Cats: A parasite can be excreted in cat feces.  To avoid exposure you need to have another person empty the little box.  If you must empty the litter box you will need to wear gloves.  Wash your hands after handling your cat.  This parasite can also be found in raw or undercooked meat so this should also be avoided.  Colds, Sore Throats, Flu: Please check your medication sheet to see what you can take for symptoms.  If your symptoms are unrelieved by these medications please call the office.  Dental Work: Most any dental work your dentist recommends is permitted.  X-rays should only be taken during the first trimester if absolutely necessary.  Your abdomen should be shielded with a lead apron during all x-rays.  Please notify your provider prior to receiving any x-rays.  Novocaine is fine; gas is not recommended.  If your dentist requires a note from us prior to dental work please call the office and we will provide one for you.  Exercise: Exercise is an important part of staying healthy during your pregnancy.  You may continue most exercises you were accustomed to prior to pregnancy.  Later in your pregnancy you will most likely notice you have difficulty with activities requiring balance like riding a bicycle.  It is important that you listen to your body and avoid activities that put you at a higher risk of falling.  Adequate rest and staying well hydrated are a must!  If you have questions about the safety of specific activities ask your provider.    Exposure to Children with illness: Try to avoid obvious exposure; report any symptoms to us when noted,  If you have chicken pos, red measles or mumps, you should be immune to these diseases.   Please do not take any vaccines while pregnant unless you have checked with your OB provider.  Fetal Movement: After 28 weeks we recommend you do "kick counts" twice daily.  Lie or sit down in a calm quiet environment and  count your baby movements "kicks".  You should feel your baby at least 10 times per hour.  If you have not felt 10 kicks within the first hour get up, walk around and have something sweet to eat or drink then repeat for an additional hour.  If count remains less than 10 per hour notify your provider.  Fumigating: Follow your pest control agent's advice as to how long to stay out of your home.  Ventilate the area well before re-entering.  Hemorrhoids:   Most over-the-counter preparations can be used during pregnancy.  Check your medication to see what is safe to use.  It is important to use a stool softener or fiber in your diet and to drink lots of liquids.  If hemorrhoids seem to be getting worse please call the office.   Hot Tubs:  Hot tubs Jacuzzis and saunas are not recommended while pregnant.  These increase your internal body temperature and should be avoided.  Intercourse:  Sexual intercourse is safe during pregnancy as long as you are comfortable, unless otherwise advised by your provider.  Spotting may occur after intercourse; report any bright red bleeding that is heavier than spotting.  Labor:  If you know that you are in labor, please go to the hospital.  If you are unsure, please call the office and let us help you decide what to do.  Lifting, straining, etc:  If your job requires heavy   lifting or straining please check with your provider for any limitations.  Generally, you should not lift items heavier than that you can lift simply with your hands and arms (no back muscles)  Painting:  Paint fumes do not harm your pregnancy, but may make you ill and should be avoided if possible.  Latex or water based paints have less odor than oils.  Use adequate ventilation while painting.  Permanents & Hair Color:  Chemicals in hair dyes are not recommended as they cause increase hair dryness which can increase hair loss during pregnancy.  " Highlighting" and permanents are allowed.  Dye may be  absorbed differently and permanents may not hold as well during pregnancy.  Sunbathing:  Use a sunscreen, as skin burns easily during pregnancy.  Drink plenty of fluids; avoid over heating.  Tanning Beds:  Because their possible side effects are still unknown, tanning beds are not recommended.  Ultrasound Scans:  Routine ultrasounds are performed at approximately 20 weeks.  You will be able to see your baby's general anatomy an if you would like to know the gender this can usually be determined as well.  If it is questionable when you conceived you may also receive an ultrasound early in your pregnancy for dating purposes.  Otherwise ultrasound exams are not routinely performed unless there is a medical necessity.  Although you can request a scan we ask that you pay for it when conducted because insurance does not cover " patient request" scans.  Work: If your pregnancy proceeds without complications you may work until your due date, unless your physician or employer advises otherwise.  Round Ligament Pain/Pelvic Discomfort:  Sharp, shooting pains not associated with bleeding are fairly common, usually occurring in the second trimester of pregnancy.  They tend to be worse when standing up or when you remain standing for long periods of time.  These are the result of pressure of certain pelvic ligaments called "round ligaments".  Rest, Tylenol and heat seem to be the most effective relief.  As the womb and fetus grow, they rise out of the pelvis and the discomfort improves.  Please notify the office if your pain seems different than that described.  It may represent a more serious condition.  AboveDisc[employer].com.cy.html">  First Trimester of Pregnancy  The first trimester of pregnancy starts on the first day of your last menstrual period until the end of week 12. This is also called months 1 through 3 of pregnancy. Body changes during your first trimester Your body goes through many  changes during pregnancy. The changes usually return to normal after your baby is born. Physical changes  You may gain or lose weight.  Your breasts may grow larger and hurt. The area around your nipples may get darker.  Dark spots or blotches may develop on your face.  You may have changes in your hair. Health changes  You may feel like you might vomit (nauseous), and you may vomit.  You may have heartburn.  You may have headaches.  You may have trouble pooping (constipation).  Your gums may bleed. Other changes  You may get tired easily.  You may pee (urinate) more often.  Your menstrual periods will stop.  You may not feel hungry.  You may want to eat certain kinds of food.  You may have changes in your emotions from day to day.  You may have more dreams. Follow these instructions at home: Medicines  Take over-the-counter and prescription medicines only as told  by your doctor. Some medicines are not safe during pregnancy.  Take a prenatal vitamin that contains at least 600 micrograms (mcg) of folic acid. Eating and drinking  Eat healthy meals that include: ? Fresh fruits and vegetables. ? Whole grains. ? Good sources of protein, such as meat, eggs, or tofu. ? Low-fat dairy products.  Avoid raw meat and unpasteurized juice, milk, and cheese.  If you feel like you may vomit, or you vomit: ? Eat 4 or 5 small meals a day instead of 3 large meals. ? Try eating a few soda crackers. ? Drink liquids between meals instead of during meals.  You may need to take these actions to prevent or treat trouble pooping: ? Drink enough fluids to keep your pee (urine) pale yellow. ? Eat foods that are high in fiber. These include beans, whole grains, and fresh fruits and vegetables. ? Limit foods that are high in fat and sugar. These include fried or sweet foods. Activity  Exercise only as told by your doctor. Most people can do their usual exercise routine during  pregnancy.  Stop exercising if you have cramps or pain in your lower belly (abdomen) or low back.  Do not exercise if it is too hot or too humid, or if you are in a place of great height (high altitude).  Avoid heavy lifting.  If you choose to, you may have sex unless your doctor tells you not to. Relieving pain and discomfort  Wear a good support bra if your breasts are sore.  Rest with your legs raised (elevated) if you have leg cramps or low back pain.  If you have bulging veins (varicose veins) in your legs: ? Wear support hose as told by your doctor. ? Raise your feet for 15 minutes, 3-4 times a day. ? Limit salt in your food. Safety  Wear your seat belt at all times when you are in a car.  Talk with your doctor if someone is hurting you or yelling at you.  Talk with your doctor if you are feeling sad or have thoughts of hurting yourself. Lifestyle  Do not use hot tubs, steam rooms, or saunas.  Do not douche. Do not use tampons or scented sanitary pads.  Do not use herbal medicines, illegal drugs, or medicines that are not approved by your doctor. Do not drink alcohol.  Do not smoke or use any products that contain nicotine or tobacco. If you need help quitting, ask your doctor.  Avoid cat litter boxes and soil that is used by cats. These carry germs that can cause harm to the baby and can cause a loss of your baby by miscarriage or stillbirth. General instructions  Keep all follow-up visits. This is important.  Ask for help if you need counseling or if you need help with nutrition. Your doctor can give you advice or tell you where to go for help.  Visit your dentist. At home, brush your teeth with a soft toothbrush. Floss gently.  Write down your questions. Take them to your prenatal visits. Where to find more information  American Pregnancy Association: americanpregnancy.org  [employer]: www.acog.org  Office on  Women's Health: KeywordPortfolios.com.br Contact a doctor if:  You are dizzy.  You have a fever.  You have mild cramps or pressure in your lower belly.  You have a nagging pain in your belly area.  You continue to feel like you may vomit, you vomit, or you have watery poop (  diarrhea) for 24 hours or longer.  You have a bad-smelling fluid coming from your vagina.  You have pain when you pee.  You are exposed to a disease that spreads from person to person, such as chickenpox, measles, Zika virus, HIV, or hepatitis. Get help right away if:  You have spotting or bleeding from your vagina.  You have very bad belly cramping or pain.  You have shortness of breath or chest pain.  You have any kind of injury, such as from a fall or a car crash.  You have new or increased pain, swelling, or redness in an arm or leg. Summary  The first trimester of pregnancy starts on the first day of your last menstrual period until the end of week 12 (months 1 through 3).  Eat 4 or 5 small meals a day instead of 3 large meals.  Do not smoke or use any products that contain nicotine or tobacco. If you need help quitting, ask your doctor.  Keep all follow-up visits. This information is not intended to replace advice given to you by your health care provider. Make sure you discuss any questions you have with your health care provider. Document Revised: 03/25/2020 Document Reviewed: 01/30/2020 Elsevier Patient Education  2021 Elsevier Inc. WHAT OB PATIENTS CAN EXPECT   Confirmation of pregnancy and ultrasound ordered if medically indicated-[redacted] weeks gestation  New OB (NOB) intake with nurse and New OB (NOB) labs- [redacted] weeks gestation  New OB (NOB) physical examination with provider- 11/[redacted] weeks gestation  Flu vaccine-[redacted] weeks gestation  Anatomy scan-[redacted] weeks gestation  Glucose tolerance test, blood work to test for anemia, T-dap vaccine-[redacted] weeks gestation  Vaginal swabs/cultures-STD/Group B  strep-[redacted] weeks gestation  Appointments every 4 weeks until 28 weeks  Every 2 weeks from 28 weeks until 36 weeks  Weekly visits from 36 weeks until delivery  Morning Sickness  Morning sickness is when you feel like you may vomit (feel nauseous) during pregnancy. Sometimes, you may vomit. Morning sickness most often happens in the morning, but it can also happen at any time of the day. Some women may have morning sickness that makes them vomit all the time. This is a more serious problem that needs treatment. What are the causes? The cause of this condition is not known. What increases the risk?  You had vomiting or a feeling like you may vomit before your pregnancy.  You had morning sickness in another pregnancy.  You are pregnant with more than one baby, such as twins. What are the signs or symptoms?  Feeling like you may vomit.  Vomiting. How is this treated? Treatment is usually not needed for this condition. You may only need to change what you eat. In some cases, your doctor may give you some things to take for your condition. These include:  Vitamin B6 supplements.  Medicines to treat the feeling that you may vomit.  Ginger. Follow these instructions at home: Medicines  Take over-the-counter and prescription medicines only as told by your doctor. Do not take any medicines until you talk with your doctor about them first.  Take multivitamins before you get pregnant. These can stop or lessen the symptoms of morning sickness. Eating and drinking  Eat dry toast or crackers before getting out of bed.  Eat 5 or 6 small meals a day.  Eat dry and bland foods like rice and baked potatoes.  Do not eat greasy, fatty, or spicy foods.  Have someone cook for you if the  smell of food causes you to vomit or to feel like you may vomit.  If you feel like you may vomit after taking prenatal vitamins, take them at night or with a snack.  Eat protein foods when you need a snack.  Nuts, yogurt, and cheese are good choices.  Drink fluids throughout the day.  Try ginger ale made with real ginger, ginger tea made from fresh grated ginger, or ginger candies. General instructions  Do not smoke or use any products that contain nicotine or tobacco. If you need help quitting, ask your doctor.  Use an air purifier to keep the air in your house free of smells.  Get lots of fresh air.  Try to avoid smells that make you feel sick.  Try wearing an acupressure wristband. This is a wristband that is used to treat seasickness.  Try a treatment called acupuncture. In this treatment, a doctor puts needles into certain areas of your body to make you feel better. Contact a doctor if:  You need medicine to feel better.  You feel dizzy or light-headed.  You are losing weight. Get help right away if:  The feeling that you may vomit will not go away, or you cannot stop vomiting.  You faint.  You have very bad pain in your belly. Summary  Morning sickness is when you feel like you may vomit (feel nauseous) during pregnancy.  You may feel sick in the morning, but you can feel this way at any time of the day.  Making some changes to what you eat may help your symptoms go away. This information is not intended to replace advice given to you by your health care provider. Make sure you discuss any questions you have with your health care provider. Document Revised: 06/01/2020 Document Reviewed: 05/11/2020 Elsevier Patient Education  [place]. Common Medications Safe in Pregnancy  Acne:      Constipation:  Benzoyl Peroxide     Colace  Clindamycin      Dulcolax Suppository  Topica Erythromycin     Fibercon  Salicylic Acid      Metamucil         Miralax AVOID:        Senakot   Accutane    Cough:  Retin-A       Cough Drops  Tetracycline      Phenergan w/ Codeine if Rx  Minocycline      Robitussin (Plain &  DM)  Antibiotics:     Crabs/Lice:  Ceclor       RID  Cephalosporins    AVOID:  E-Mycins      Kwell  Keflex  Macrobid/Macrodantin   Diarrhea:  Penicillin      Kao-Pectate  Zithromax      Imodium AD         PUSH FLUIDS AVOID:       Cipro     Fever:  Tetracycline      Tylenol (Regular or Extra  Minocycline       Strength)  Levaquin      Extra Strength-Do not          Exceed 8 tabs/24 hrs Caffeine:        '200mg'$ /day (equiv. To 1 cup of coffee or  approx. 3 12 oz sodas)         Gas: Cold/Hayfever:       Gas-X  Benadryl      Mylicon  Claritin       Phazyme  **Claritin-D  Chlor-Trimeton    Headaches:  Dimetapp      ASA-Free Excedrin  Drixoral-Non-Drowsy     Cold Compress  Mucinex (Guaifenasin)     Tylenol (Regular or Extra  Sudafed/Sudafed-12 Hour     Strength)  **Sudafed PE Pseudoephedrine   Tylenol Cold & Sinus     Vicks Vapor Rub  Zyrtec  **AVOID if Problems With Blood Pressure         Heartburn: Avoid lying down for at least 1 hour after meals  Aciphex      Maalox     Rash:  Milk of Magnesia     Benadryl    Mylanta       1% Hydrocortisone Cream  Pepcid  Pepcid Complete   Sleep Aids:  Prevacid      Ambien   Prilosec       Benadryl  Rolaids       Chamomile Tea  Tums (Limit 4/day)     Unisom         Tylenol PM         Warm milk-add vanilla or  Hemorrhoids:       Sugar for taste  Anusol/Anusol H.C.  (RX: Analapram 2.5%)  Sugar Substitutes:  Hydrocortisone OTC     Ok in moderation  Preparation H      Tucks        Vaseline lotion applied to tissue with wiping    Herpes:     Throat:  Acyclovir      Oragel  Famvir  Valtrex     Vaccines:         Flu Shot Leg Cramps:       *Gardasil  Benadryl      Hepatitis A         Hepatitis B Nasal Spray:       Pneumovax  Saline Nasal Spray     Polio Booster         Tetanus Nausea:       Tuberculosis test or PPD  Vitamin B6 25 mg TID   AVOID:    Dramamine      *Gardasil  Emetrol       Live Poliovirus  Ginger  Root 250 mg QID    MMR (measles, mumps &  High Complex Carbs @ Bedtime    rebella)  Sea Bands-Accupressure    Varicella (Chickenpox)  Unisom 1/2 tab TID     *No known complications           If received before Pain:         Known pregnancy;   Darvocet       Resume series after  Lortab        Delivery  Percocet    Yeast:   Tramadol      Femstat  Tylenol 3      Gyne-lotrimin  Ultram       Monistat  Vicodin           MISC:         All Sunscreens           Hair Coloring/highlights          Insect Repellant's          (Including DEET)         Mystic Tans

## 2020-12-18 NOTE — Progress Notes (Signed)
PT is present today for confirmation of pregnancy. Pt LMP 10/22/2020. UPT done today results were positive. Pt stated that she is doing well no complaints. Pt is aware of pregnancy information and safe medication list placed in today's visit. Pt was advised to have her AVS printed during check out.

## 2020-12-19 NOTE — Progress Notes (Signed)
GYN ENCOUNTER NOTE  Subjective:       Shannon Alvarado is a 25 y.o. G1P0 female here for confirmation of pregnancy.   Endorses breast tenderness, nausea with occasional vomiting, and intermittent lower abdominal cramping.   Questions estimated due date since last cycle was "longer than usual".   Denies difficulty breathing or respiratory distress, chest pain, abdominal pain, vaginal bleeding, dysuria, and leg pain or swelling.    Gynecologic History  Patient's last menstrual period was 10/22/2020 (within weeks).   Estimated date of birth: 07/29/2022  Gestational age: 39 weeks 1 day  Contraception: none   Last Pap: 08/2016. Results were: Neg/Neg  Obstetric History  OB History  Gravida Para Term Preterm AB Living  1            SAB IAB Ectopic Multiple Live Births               # Outcome Date GA Lbr Len/2nd Weight Sex Delivery Anes PTL Lv  1 Current             Past Medical History:  Diagnosis Date  . Asthma   . Irregular periods   . Obesity     History reviewed. No pertinent surgical history.  Current Outpatient Medications on File Prior to Visit  Medication Sig Dispense Refill  . albuterol (VENTOLIN HFA) 108 (90 Base) MCG/ACT inhaler Inhale 2 puffs into the lungs every 6 (six) hours as needed for wheezing or shortness of breath. 8 g 2  . ondansetron (ZOFRAN) 4 MG tablet Take 1 tablet (4 mg total) by mouth every 6 (six) hours as needed for nausea or vomiting. (Patient not taking: Reported on 12/18/2020) 12 tablet 0   No current facility-administered medications on file prior to visit.    No Known Allergies  Social History   Socioeconomic History  . Marital status: Single    Spouse name: Not on file  . Number of children: Not on file  . Years of education: Not on file  . Highest education level: Not on file  Occupational History  . Not on file  Tobacco Use  . Smoking status: Never Smoker  . Smokeless tobacco: Never Used  Vaping Use  . Vaping Use: Never  used  Substance and Sexual Activity  . Alcohol use: No  . Drug use: No  . Sexual activity: Yes  Other Topics Concern  . Not on file  Social History Narrative  . Not on file   Social Determinants of Health   Financial Resource Strain: Not on file  Food Insecurity: Not on file  Transportation Needs: Not on file  Physical Activity: Not on file  Stress: Not on file  Social Connections: Not on file  Intimate Partner Violence: Not on file    Family History  Problem Relation Age of Onset  . Cancer Mother        ovarian  . Ovarian cancer Mother   . Cancer Maternal Aunt        breast  . Breast cancer Maternal Aunt   . Hyperlipidemia Father   . Hypertension Father     The following portions of the patient's history were reviewed and updated as appropriate: allergies, current medications, past family history, past medical history, past social history, past surgical history and problem list.  Review of Systems  ROS negative except as noted above. Information obtained from patient.   Objective:   BP 108/77   Pulse 78   Ht 5\' 2"  (1.575 m)  Wt 184 lb 9.6 oz (83.7 kg)   LMP 10/22/2020 (Within Weeks)   BMI 33.76 kg/m    CONSTITUTIONAL: Well-developed, well-nourished female in no acute distress.   PHYSICAL EXAM: Not indicated.   Recent Results (from the past 2160 hour(s))  POCT urine pregnancy     Status: Abnormal   Collection Time: 12/18/20  1:59 PM  Result Value Ref Range   Preg Test, Ur Positive (A) Negative    Assessment:   1. Missed menses - POCT urine pregnancy  2. Positive pregnancy test   Plan:   First trimester education, see AVS.   Prenatal vitamin samples provided.   Reviewed red flag symptoms and when to call.   RTC x 1-2 weeks for dating/viability ultrasound & intake.  RTC x 4-5 weeks for NOB PE or sooner if needed.    Serafina Royals, CNM [facility], [facility]

## 2020-12-31 ENCOUNTER — Ambulatory Visit (INDEPENDENT_AMBULATORY_CARE_PROVIDER_SITE_OTHER): Payer: Self-pay | Admitting: Certified Nurse Midwife

## 2020-12-31 ENCOUNTER — Other Ambulatory Visit: Payer: Self-pay

## 2020-12-31 ENCOUNTER — Encounter: Payer: Self-pay | Admitting: Certified Nurse Midwife

## 2020-12-31 VITALS — BP 106/85 | HR 78 | Resp 16 | Wt 188.0 lb

## 2020-12-31 DIAGNOSIS — O468X1 Other antepartum hemorrhage, first trimester: Secondary | ICD-10-CM

## 2020-12-31 DIAGNOSIS — Z6710 Type A blood, Rh positive: Secondary | ICD-10-CM

## 2020-12-31 DIAGNOSIS — O418X10 Other specified disorders of amniotic fluid and membranes, first trimester, not applicable or unspecified: Secondary | ICD-10-CM

## 2020-12-31 DIAGNOSIS — O200 Threatened abortion: Secondary | ICD-10-CM

## 2020-12-31 DIAGNOSIS — O3680X0 Pregnancy with inconclusive fetal viability, not applicable or unspecified: Secondary | ICD-10-CM

## 2020-12-31 NOTE — Patient Instructions (Signed)
Vaginal Bleeding During Pregnancy, First Trimester A small amount of bleeding from the vagina is common during early pregnancy. This kind of bleeding is also called spotting. Sometimes the bleeding is normal and does not cause problems. At other times, though, bleeding may be a sign of something serious. Normal bleeding in pregnancy can happen:  When the fertilized egg attaches itself to your womb.  When blood vessels change because of the pregnancy.  When you have pelvic exams.  When you have sex. Abnormal bleeding can happen:  When you have an infection.  When you have growths in your womb. The growths are called polyps.  If you are having a miscarriage or at risk of having one.  If you have other problems in your pregnancy. Tell your doctor right away about any bleeding from your vagina. Follow these instructions at home: Watch your bleeding  Watch your condition for any changes. Let your doctor know if you are worried about something.  Try to know what causes your bleeding. Ask yourself these questions: ? Does the bleeding start on its own? ? Does the bleeding start after something is done, such as sex or a pelvic exam?  Use a diary to write the things you see about your bleeding. Write in your diary: ? If the bleeding flows freely without stopping, or if it starts and stops, and then starts again. ? If the bleeding is heavy or light. ? How many pads you use in a day and how much blood is in them.  Tell your doctor if you pass tissue. He or she may want to see it.   Activity  Follow your doctor's instructions about how active you can be. Ask what activities are safe for you.  Do not have sex or orgasms until your doctor says that this is safe.  If needed, make plans for someone to help with your normal activities. General instructions  Take over-the-counter and prescription medicines only as told by your doctor.  Do not take aspirin because it can cause  bleeding.  Do not use tampons.  Do not douche.  Keep all follow-up visits. Contact a doctor if:  You have vaginal bleeding at any time while you are pregnant.  You have cramps.  You have a fever or chills. Get help right away if:  You have very bad cramps in your back or belly (abdomen).  You pass large clots or a lot of tissue from your vagina.  Your bleeding gets worse.  You feel light-headed.  You feel weak.  You pass out (faint).  You have chills.  You are leaking fluid from your vagina.  You have a gush of fluid from your vagina. Summary  Sometimes vaginal bleeding during pregnancy is normal and does not cause problems. At other times, bleeding may be a sign of something serious.  Tell your doctor right away about any bleeding from your vagina.  Follow your doctor's instructions about how active you can be. You may need someone to help you with your normal activities.  Keep all follow-up visits. This information is not intended to replace advice given to you by your health care provider. Make sure you discuss any questions you have with your health care provider. Document Revised: 07/09/2020 Document Reviewed: 07/09/2020 Elsevier Patient Education  2021 Elsevier Inc.   Subchorionic Hematoma  A hematoma is a collection of blood outside of the blood vessels. A subchorionic hematoma is a collection of blood between the outer wall of the embryo (  chorion) and the inner wall of the uterus. This condition can cause vaginal bleeding. Early small hematomas usually shrink on their own and do not affect your baby or pregnancy. When bleeding starts later in pregnancy, or if the hematoma is larger or occurs in older pregnant women, the condition may be more serious. Larger hematomas increase the chances of miscarriage. This condition also increases the risk of:  Premature separation of the placenta from the uterus.  Premature (preterm) labor.  Stillbirth. What are  the causes? The exact cause of this condition is not known. It occurs when blood is trapped between the placenta and the uterine wall because the placenta has separated from the original site of implantation. What increases the risk? You are more likely to develop this condition if:  You were treated with fertility medicines.  You became pregnant through in vitro fertilization (IVF). What are the signs or symptoms? Symptoms of this condition include:  Vaginal spotting or bleeding.  Abdominal pain. This is rare. Sometimes you may have no symptoms and the bleeding may only be seen when ultrasound images are taken (transvaginal ultrasound). How is this diagnosed? This condition is diagnosed based on a physical exam. This includes a pelvic exam. You may also have other tests, including:  Blood tests.  Urine tests.  Ultrasound of the abdomen. How is this treated? Treatment for this condition can vary. Treatment may include:  Watchful waiting. You will be monitored closely for any changes in bleeding.  Medicines.  Activity restriction. This may be needed until the bleeding stops.  A medicine called Rh immunoglobulin. This is given if you have an Rh-negative blood type. It prevents Rh sensitization. Follow these instructions at home:  Stay on bed rest if told to do so by your health care provider.  Do not lift anything that is heavier than 10 lb (4.5 kg), or the limit that you are told by your health care provider.  Track and write down the number of pads you use each day and how soaked (saturated) they are.  Do not use tampons.  Keep all follow-up visits. This is important. Your health care provider may ask you to have follow-up blood tests or ultrasound tests or both. Contact a health care provider if:  You have any vaginal bleeding.  You have a fever. Get help right away if:  You have severe cramps in your stomach, back, abdomen, or pelvis.  You pass large clots or  tissue. Save any tissue for your health care provider to look at.  You faint.  You become light-headed or weak. Summary  A subchorionic hematoma is a collection of blood between the outer wall of the embryo (chorion) and the inner wall of the uterus.  This condition can cause vaginal bleeding.  Sometimes you may have no symptoms and the bleeding may only be seen when ultrasound images are taken.  Treatment may include watchful waiting, medicines, or activity restriction.  Keep all follow-up visits. Get help right away if you have severe cramps or heavy vaginal bleeding. This information is not intended to replace advice given to you by your health care provider. Make sure you discuss any questions you have with your health care provider. Document Revised: 07/13/2020 Document Reviewed: 07/13/2020 Elsevier Patient Education  2021 [employer].

## 2020-12-31 NOTE — Progress Notes (Signed)
GYN ENCOUNTER NOTE  Subjective:       Shannon Alvarado is a 25 y.o. G1P0 female is here for gynecologic evaluation of the following issues:  1. Threaten abortion 2. Subchronic hemorrhage   Seen by [facility] on 12/30/2020; for further details, please see note.   Reports vaginal bleeding after intercourse that has since subsided.   Denies difficulty breathing or respiratory distress, chest pain, abdominal pain, vaginal bleeding, dysuria, and leg pain or swelling.    Gynecologic History  Patient's last menstrual period was 11/01/2020 (exact date).  Contraception: none  Last Pap: 08/2016. Results were: Neg/Neg  Obstetric History  OB History  Gravida Para Term Preterm AB Living  1            SAB IAB Ectopic Multiple Live Births               # Outcome Date GA Lbr Len/2nd Weight Sex Delivery Anes PTL Lv  1 Current             Past Medical History:  Diagnosis Date  . Asthma   . Irregular periods   . Obesity     No past surgical history on file.  Current Outpatient Medications on File Prior to Visit  Medication Sig Dispense Refill  . albuterol (VENTOLIN HFA) 108 (90 Base) MCG/ACT inhaler Inhale 2 puffs into the lungs every 6 (six) hours as needed for wheezing or shortness of breath. 8 g 2   No current facility-administered medications on file prior to visit.    No Known Allergies  Social History   Socioeconomic History  . Marital status: Single    Spouse name: Not on file  . Number of children: Not on file  . Years of education: Not on file  . Highest education level: Not on file  Occupational History  . Not on file  Tobacco Use  . Smoking status: Never Smoker  . Smokeless tobacco: Never Used  Vaping Use  . Vaping Use: Never used  Substance and Sexual Activity  . Alcohol use: No  . Drug use: No  . Sexual activity: Yes  Other Topics Concern  . Not on file  Social History Narrative  . Not on file   Social Determinants of Health   Financial Resource  Strain: Not on file  Food Insecurity: Not on file  Transportation Needs: Not on file  Physical Activity: Not on file  Stress: Not on file  Social Connections: Not on file  Intimate Partner Violence: Not on file    Family History  Problem Relation Age of Onset  . Cancer Mother        ovarian  . Ovarian cancer Mother   . Cancer Maternal Aunt        breast  . Breast cancer Maternal Aunt   . Hyperlipidemia Father   . Hypertension Father     The following portions of the patient's history were reviewed and updated as appropriate: allergies, current medications, past family history, past medical history, past social history, past surgical history and problem list.  Review of Systems  ROS negative except as noted above. Information obtained from patient.   Objective:   BP 106/85   Pulse 78   Resp 16   Wt 188 lb (85.3 kg)   LMP 11/01/2020 (Exact Date)   BMI 34.39 kg/m   CONSTITUTIONAL: Well-developed, well-nourished female in no acute distress.   ABDOMEN: Soft, non distended; Non tender.  No Organomegaly.  PELVIC: Declined by patient.  MUSCULOSKELETAL: Normal range of motion. No tenderness.  No cyanosis, clubbing, or edema.  EXAM: US OB TRANSVAGINAL  DATE: 12/30/2020 5:20 AM  ACCESSION: [id number] UN  DICTATED: 12/30/2020 5:21 AM  [facility] LOC[facility]   CLINICAL INDICATION: lower abdominal pain, cramping, vaginal bleeding    COMPARISON: None   TECHNIQUE: Ultrasound views of the pelvis were obtained endovaginally and transabdominally using gray scale and color Doppler imaging. Spectral Doppler was also performed. Additional transabdominal images were obtained.   FINDINGS:   There is a single live intrauterine pregnancy.    Cardiac activity is not seen. It is too early to evaluate placental location or measure amniotic fluid index. Yolk sac was not identified.   Crown rump length, as below, corresponds with an estimated gestational age of [redacted] weeks 1  days.  LMP (date as below) corresponds with an estimated gestational age of [redacted] weeks 2 days.   Assigned gestation of 6 weeks 1 day is based on crown-rump length.   OVARIES: The left ovary was seen well transvaginally. The right ovary was not visualizedtransvaginally or transabdominally. Small cystic areas were seen within the left ovary compatible with follicles. There is a 1.6 x 1.2 x 1.5 cm corpus luteum. Appropriate arterial inflow and venous outflow was seen in the left ovary with color and spectral doppler.   OTHER: There is a small hypoechoic area beneath the chorion. Trace fluid within the cervix.  Procedure Note  Sallee Lange, MD - 12/30/2020  Formatting of this note might be different from the original.  EXAM: US OB TRANSVAGINAL  DATE: 12/30/2020 5:20 AM  ACCESSION: [id number] UN  DICTATED: 12/30/2020 5:21 AM  INTERPRETATION LOCATION: [facility]   CLINICAL INDICATION: lower abdominal pain, cramping, vaginal bleeding    COMPARISON: None   TECHNIQUE: Ultrasound views of the pelvis were obtained endovaginally and transabdominally using gray scale and color Doppler imaging. Spectral Doppler was also performed. Additional transabdominal images were obtained.   FINDINGS:   There is a single live intrauterine pregnancy.    Cardiac activity is not seen. It is too early to evaluate placental location or measure amniotic fluid index. Yolk sac was not identified.   Crown rump length, as below, corresponds with an estimated gestational age of [redacted] weeks 1 days.  LMP (date as below) corresponds with an estimated gestational age of [redacted] weeks 2 days.   Assigned gestation of 6 weeks 1 day is based on crown-rump length.   OVARIES: The left ovary was seen well transvaginally. The right ovary was not visualized transvaginally or transabdominally. Small cystic areas were seen within the left ovary compatible with follicles. There is a 1.6 x 1.2 x 1.5 cm corpusluteum. Appropriate  arterial inflow and venous outflow was seen in the left ovary with color and spectral doppler.   OTHER: There is a small hypoechoic area beneath the chorion. Trace fluid within the cervix.   IMPRESSION:   -Single intrauterine pregnancy with crown-rump length measuring 0.4 cm in the absence of cardiac activity. No fetal pole or yolk sac visualized. Findings are suspicious for pregnancy failure. Recommend clinical correlation, serial quantitative beta hCGs, and follow-up ultrasound.   -Small subchorionic hemorrhage.   Spectral doppler was performed on left ovary.  Additional transabdominal images were obtained.   Please see below for data measurements:    LMP: 12/22  HCG 114,041.3   Yolk sac cm  Gestational sac 3.09 cm  Crown rump length 0.41 cm  Cardiac activity no  FHR:   Uterus: length 10.5  cm; width 8.1 cm; height 6.1 cm  Endometrium: 0.42 cm  Right ovary: length cm; transverse cm; AP cm  Left ovary: length 2.5 cm; transverse 2.5 cm; AP 1.9 cm   Free fluid visualized:yes  Exam End: 12/30/20 5:20 AM   Specimen Collected: 12/30/20 5:21 AM Last Resulted: 12/30/20 11:19 AM  Received From: [facility]  Result Received: 12/31/20 10:06 AM     Assessment:   1. Threatened abortion  - US OB LESS THAN 14 WEEKS WITH OB TRANSVAGINAL; Future - Beta hCG quant (ref lab) - Progesterone  2. Type A blood, Rh positive  - US OB LESS THAN 14 WEEKS WITH OB TRANSVAGINAL; Future - Beta hCG quant (ref lab)  3. Pregnancy with inconclusive fetal viability, single or unspecified fetus  - US OB LESS THAN 14 WEEKS WITH OB TRANSVAGINAL; Future - Beta hCG quant (ref lab) - Progesterone  4. Subchorionic hematoma in first trimester, single or unspecified fetus  - US OB LESS THAN 14 WEEKS WITH OB TRANSVAGINAL; Future - Beta hCG quant (ref lab) - Progesterone     Plan:   Ultrasound findings discussed with patient, verbalized understanding.   Advised to continue pelvic rest  until next appointment.   Reviewed red flag symptoms and when to call.   RTC tomorrow for labs. Ultrasound scheduled for next week, see orders.    Serafina Royals, CNM [facility], [facility] 12/31/20 3:29 PM

## 2021-01-01 ENCOUNTER — Encounter: Payer: Medicaid Other | Admitting: Certified Nurse Midwife

## 2021-01-01 ENCOUNTER — Other Ambulatory Visit: Payer: Medicaid Other

## 2021-01-01 ENCOUNTER — Other Ambulatory Visit: Payer: Self-pay

## 2021-01-02 LAB — PROGESTERONE: Progesterone: 13.5 ng/mL

## 2021-01-02 LAB — BETA HCG QUANT (REF LAB): hCG Quant: 101773 m[IU]/mL

## 2021-01-07 ENCOUNTER — Other Ambulatory Visit: Payer: Medicaid Other

## 2021-01-07 ENCOUNTER — Other Ambulatory Visit: Payer: Self-pay

## 2021-01-07 ENCOUNTER — Ambulatory Visit (INDEPENDENT_AMBULATORY_CARE_PROVIDER_SITE_OTHER): Payer: Self-pay | Admitting: Surgical

## 2021-01-07 VITALS — BP 141/100 | HR 98 | Ht 62.0 in | Wt 191.9 lb

## 2021-01-07 DIAGNOSIS — Z3491 Encounter for supervision of normal pregnancy, unspecified, first trimester: Secondary | ICD-10-CM

## 2021-01-07 NOTE — Progress Notes (Signed)
Nurse intake not needed.

## 2021-01-11 ENCOUNTER — Telehealth: Payer: Self-pay | Admitting: Certified Nurse Midwife

## 2021-01-11 ENCOUNTER — Other Ambulatory Visit: Payer: Self-pay

## 2021-01-11 ENCOUNTER — Ambulatory Visit
Admission: RE | Admit: 2021-01-11 | Discharge: 2021-01-11 | Disposition: A | Discharge status: Home / Self Care | Payer: Self-pay | Source: Ambulatory Visit | Attending: Certified Nurse Midwife | Admitting: Certified Nurse Midwife

## 2021-01-11 DIAGNOSIS — Z6710 Type A blood, Rh positive: Secondary | ICD-10-CM | POA: Insufficient documentation

## 2021-01-11 DIAGNOSIS — O468X1 Other antepartum hemorrhage, first trimester: Secondary | ICD-10-CM | POA: Insufficient documentation

## 2021-01-11 DIAGNOSIS — O200 Threatened abortion: Secondary | ICD-10-CM | POA: Insufficient documentation

## 2021-01-11 DIAGNOSIS — O3680X0 Pregnancy with inconclusive fetal viability, not applicable or unspecified: Secondary | ICD-10-CM | POA: Insufficient documentation

## 2021-01-11 DIAGNOSIS — O418X10 Other specified disorders of amniotic fluid and membranes, first trimester, not applicable or unspecified: Secondary | ICD-10-CM | POA: Insufficient documentation

## 2021-01-11 NOTE — Telephone Encounter (Signed)
New message    Patient had ultrasound test done today, was told that her nurse would be calling back .

## 2021-01-11 NOTE — Telephone Encounter (Signed)
Patient and significant other walked in to the office today with questions regarding ultrasound and blood levels.   Informed awaiting for ultrasound results. Questions regarding hemorrhage and miscarriage answered.   Reviewed red flag symptoms and when to call.   Advised patient and significant other that CNM would contact with ultrasound results when available.    Shannon Alvarado, CNM [facility], [facility] 01/11/21 4:32 PM

## 2021-01-12 ENCOUNTER — Telehealth: Payer: Self-pay | Admitting: Certified Nurse Midwife

## 2021-01-12 NOTE — Telephone Encounter (Signed)
Patient called in and states she is having swelling on her left side again.  Patient states she is bleeding a little more than usual and feels a knot/lump on her left side right above her ribs, and is sweating.  Patient would like to know what to do.  Please advise.

## 2021-01-12 NOTE — Telephone Encounter (Signed)
Spoke to pt concerning her call to the office. Pt stated that she was still in a lot of pain. Pt was advised to go to the ED due to her symptoms and directions given by [facility]. Pt stated that she would go to the ED to be treated.

## 2021-01-14 ENCOUNTER — Other Ambulatory Visit: Payer: Self-pay

## 2021-01-14 ENCOUNTER — Encounter: Payer: Self-pay | Admitting: Certified Nurse Midwife

## 2021-01-14 ENCOUNTER — Ambulatory Visit (INDEPENDENT_AMBULATORY_CARE_PROVIDER_SITE_OTHER): Payer: Self-pay | Admitting: Certified Nurse Midwife

## 2021-01-14 VITALS — BP 122/71 | HR 83 | Wt 189.2 lb

## 2021-01-14 DIAGNOSIS — O468X1 Other antepartum hemorrhage, first trimester: Secondary | ICD-10-CM

## 2021-01-14 DIAGNOSIS — O021 Missed abortion: Secondary | ICD-10-CM

## 2021-01-14 DIAGNOSIS — O418X10 Other specified disorders of amniotic fluid and membranes, first trimester, not applicable or unspecified: Secondary | ICD-10-CM

## 2021-01-14 LAB — POCT URINALYSIS DIPSTICK OB
Bilirubin, UA: NEGATIVE
Blood, UA: NEGATIVE
Glucose, UA: NEGATIVE
Ketones, UA: NEGATIVE
Leukocytes, UA: NEGATIVE
Nitrite, UA: NEGATIVE
POC,PROTEIN,UA: NEGATIVE
Spec Grav, UA: 1.01 (ref 1.010–1.025)
Urobilinogen, UA: 0.2 E.U./dL
pH, UA: 5 (ref 5.0–8.0)

## 2021-01-14 MED ORDER — MISOPROSTOL 200 MCG PO TABS: 800.0000 ug | ORAL_TABLET | Freq: Once | ORAL | 1 refills | Status: DC

## 2021-01-14 MED ORDER — OXYCODONE-ACETAMINOPHEN 5-325 MG PO TABS: 1.0000 | ORAL_TABLET | Freq: Four times a day (QID) | ORAL | 0 refills | Status: DC | PRN

## 2021-01-14 NOTE — Telephone Encounter (Signed)
Please schedule appointment for today. To discuss ultrasound and symptoms. Thanks, JML

## 2021-01-14 NOTE — Progress Notes (Signed)
As per patient noticed little staining 2 days ago but deny for today. Very sharp intermittent pain and pressure on RLQ today and had Left UQ  yesterday and day before yesterday.

## 2021-01-14 NOTE — Progress Notes (Signed)
Subjective:   Shannon Alvarado is a 25 y.o. G1P0 [redacted]w[redacted]d being seen today for follow up appointment regarding threaten abortion.   Reports abdominal pain and bloating with occasional spotting.   Denies difficulty breathing or respiratory distress, chest pain, excessive vaginal bleeding, dysuria, and leg pain or swelling.   The following portions of the patient's history were reviewed and updated as appropriate: allergies, current medications, past family history, past medical history, past social history, past surgical history and problem list.   Review of Systems:  ROS negative except as noted above. Information obtained from patient.   Objective:   BP 122/71   Pulse 83   Wt 189 lb 3.2 oz (85.8 kg)   LMP 11/01/2020 (Exact Date)   BMI 34.61 kg/m   Abdomen:  soft, gravid, appropriate for gestational age,tender   Results for orders placed or performed in visit on 01/14/21 (from the past 24 hour(s))  POC Urinalysis Dipstick OB     Status: Normal   Collection Time: 01/14/21  2:29 PM  Result Value Ref Range   Color, UA yellow    Clarity, UA clear    Glucose, UA Negative Negative   Bilirubin, UA Negative    Ketones, UA Negative    Spec Grav, UA 1.010 1.010 - 1.025   Blood, UA Negative    pH, UA 5.0 5.0 - 8.0   POC,PROTEIN,UA Negative Negative, Trace, Small (1+), Moderate (2+), Large (3+), 4+   Urobilinogen, UA 0.2 0.2 or 1.0 E.U./dL   Nitrite, UA Negative    Leukocytes, UA Negative Negative   Appearance     Odor       EXAM: OBSTETRIC <14 WK [place] AND TRANSVAGINAL OB US  CLINICAL DATA:  Threatened abortion, subchorionic hemorrhage, beta HCG 101,773 01/01/2021  TECHNIQUE: Both transabdominal and transvaginal ultrasound examinations were performed for complete evaluation of the gestation as well as the maternal uterus, adnexal regions, and pelvic cul-de-sac. Transvaginal technique was performed to assess early pregnancy.  COMPARISON:  None.  FINDINGS: Intrauterine  gestational sac: Single, elongated  Yolk sac:  Not Visualized.  Embryo:  Visualized.  Cardiac Activity: Not Visualized.  MSD: 29.7 mm   8 w   1 d  CRL:  3.8 mm   6 w   0 d                  [place] EDC: 09/06/2021  Subchorionic hemorrhage: Multiple areas of subchorionic hemorrhage are seen along the gestational sac, seen along the right superior, left lateral, and inferior aspect. Largest area inferiorly measures 1.6 x 2.6 x 0.8 cm.  Maternal uterus/adnexae: Left ovary measures 2.5 x 1.7 x 1.8 cm, with likely corpus luteum cyst. Right ovary is not well seen. No free fluid  IMPRESSION: 1. Elongated intrauterine gestational sac, with possible small embryo identified with a crown-rump length measuring 3.8 mm. No cardiac activity identified. Findings are suspicious but not yet definitive for failed pregnancy. Recommend follow-up US in 10-14 days for definitive diagnosis. This recommendation follows SRU consensus guidelines: Diagnostic Criteria for Nonviable Pregnancy Early in the First Trimester. Malva Limes Med 2013; 789:3810-17. 2. Large areas of subchorionic hemorrhage as above. 3. Small corpus luteum cyst within the left ovary. Right ovary not identified.   Assessment:   Pregnancy:  G1P0 at [redacted]w[redacted]d  1. Missed abortion   2. Subchorionic hematoma in first trimester, single or unspecified fetus   Plan:   Ultrasound findings reviewed with patient and significant other, verbalized understanding.   Condolences  offered.   Discussed miscarriage management options, see AVS.   Decision for cytotec, plans to take over the weekend.   Rx Cytotec and Percocet, see orders.   Reviewed red flag symptoms and when to call.   RTC x 1 week for follow up or sooner if needed.    Serafina Royals, CNM [facility], [facility] 01/14/21 2:25 PM

## 2021-01-14 NOTE — Patient Instructions (Signed)
FACTS YOU SHOULD KNOW  About Early Pregnancy Loss  WHAT IS AN EARLY PREGNANCY LOSS? Once the egg is fertilized with the sperm and begins to develop, it attaches to the lining of the uterus. This early pregnancy tissue may not develop into an embryo (the beginning stage of a baby). Sometimes an embryo does develop but does not continue to grow. These problems can be seen on ultrasound.   MANAGEMNT OF EARLY PREGNANCY LOSS: About 4 out of 100 (0.25%) women will have a pregnancy loss in her lifetime.  One in five pregnancies is found to be an early pregnancy loss.  There are 3 ways to care for an early pregnancy loss:   (1) Surgery, (2) Medicine, (3) Waiting for you to pass the pregnancy on your own. The decision as to how to proceed after being diagnosed with and early pregnancy loss is an individual one.  The decision can be made only after appropriate counseling.  You need to weigh the pros and cons of the 3 choices. Then you can make the choice that works for you.  SURGERY (D&E) . Procedure over in 1 day . Requires being put to sleep . Bleeding may be light . Possible problems during surgery, including injury to womb(uterus) . Care provider has more control Medicine (CYTOTEC) . The complete procedure may take days to weeks . No Surgery . Bleeding may be heavy at times . There may be drug side effects . Patient has more control Waiting . You may choose to wait, in which case your own body may complete the passing of the abnormal early pregnancy on its own in about 2-4 weeks . Your bleeding may be heavy at times . There is a small possibility that you may need surgery if the bleeding is too much or not all of the pregnancy has passed.  CYTOTEC MANAGEMENT Prostaglandins (cytotec) are the most widely used drug for this purpose. They cause the uterus to cramp and contract. You will place the medicine yourself inside your vagina in the privacy of your home. Empting of the uterus should occur  within 3 days but the process may continue for several weeks. The bleeding may seem heavy at times.  INSTRUCTIONS: Take all 4 tablets of cytotec (800mcg total) at one time. This will cause a lot of cramping, you may have bleeding, and pass tissue, then the cramping and bleeding should get better. If you do not pass the tissue, then you can take 4 more tablets of cytotec (800mcg total) 48 hours after your first dose.  You will come back to have your blood drawn to make sure the pregnancy hormones are dropping in 1 week. Please call us if you have any questions.   POSSIBLE SIDE EFFECTS FROM CYTOTEC . Nausea  Vomiting . Diarrhea Fever . Chills  Hot Flashes Side effects  from the process of the early pregnancy loss include: . Cramping  Bleeding . Headaches  Dizziness RISKS: This is a low risk procedure. Less than 1 in 100 women has a complication. An incomplete passage of the early pregnancy may occur. Also, hemorrhage (heavy bleeding) could happen.  Rarely the pregnancy will not be passed completely. Excessively heavy bleeding may occur.  Your doctor may need to perform surgery to empty the uterus (D&E). Afterwards: Everybody will feel differently after the early pregnancy loss completion. You may have soreness or cramps for a day or two. You may have soreness or cramps for day or two.  You may have light   bleeding for up to 2 weeks. You may be as active as you feel like being. If you have any of the following problems you may call [facility] at [phone]. . If you have pain that does not get better with pain medication . Bleeding that soaks through 2 thick full-sized sanitary pads in an hour . Cramps that last longer than 2 days . Foul smelling discharge . Fever above 100.4 degrees F Even if you do not have any of these symptoms, you should have a follow-up exam to make sure you are healing properly. Your next normal period will usually start again in 4-6 week after the loss. You can get pregnant  soon after the loss, so use birth control right away. Finally: Make sure all your questions are answered before during and after any procedure. Follow up with medical care and family planning methods.    Shannon Alvarado, CNM [facility], [facility] 01/14/21 2:36 PM

## 2021-01-19 ENCOUNTER — Emergency Department: Payer: Medicaid Other

## 2021-01-19 ENCOUNTER — Emergency Department
Admission: EM | Admit: 2021-01-19 | Discharge: 2021-01-19 | Disposition: A | Discharge status: Home / Self Care | Payer: Medicaid Other | Attending: Emergency Medicine | Admitting: Emergency Medicine

## 2021-01-19 ENCOUNTER — Encounter: Payer: Self-pay | Admitting: *Deleted

## 2021-01-19 ENCOUNTER — Other Ambulatory Visit: Payer: Self-pay

## 2021-01-19 DIAGNOSIS — O99511 Diseases of the respiratory system complicating pregnancy, first trimester: Secondary | ICD-10-CM | POA: Insufficient documentation

## 2021-01-19 DIAGNOSIS — R42 Dizziness and giddiness: Secondary | ICD-10-CM | POA: Insufficient documentation

## 2021-01-19 DIAGNOSIS — N939 Abnormal uterine and vaginal bleeding, unspecified: Secondary | ICD-10-CM

## 2021-01-19 DIAGNOSIS — O039 Complete or unspecified spontaneous abortion without complication: Secondary | ICD-10-CM | POA: Insufficient documentation

## 2021-01-19 DIAGNOSIS — Z3A01 Less than 8 weeks gestation of pregnancy: Secondary | ICD-10-CM | POA: Insufficient documentation

## 2021-01-19 DIAGNOSIS — J45909 Unspecified asthma, uncomplicated: Secondary | ICD-10-CM | POA: Insufficient documentation

## 2021-01-19 LAB — CBC WITH DIFFERENTIAL/PLATELET
Abs Immature Granulocytes: 0.03 10*3/uL (ref 0.00–0.07)
Basophils Absolute: 0 10*3/uL (ref 0.0–0.1)
Basophils Relative: 0 %
Eosinophils Absolute: 0.2 10*3/uL (ref 0.0–0.5)
Eosinophils Relative: 2 %
HCT: 36 % (ref 36.0–46.0)
Hemoglobin: 11.3 g/dL — ABNORMAL LOW (ref 12.0–15.0)
Immature Granulocytes: 0 %
Lymphocytes Relative: 31 %
Lymphs Abs: 2.7 10*3/uL (ref 0.7–4.0)
MCH: 26.7 pg (ref 26.0–34.0)
MCHC: 31.4 g/dL (ref 30.0–36.0)
MCV: 85.1 fL (ref 80.0–100.0)
Monocytes Absolute: 0.6 10*3/uL (ref 0.1–1.0)
Monocytes Relative: 7 %
Neutro Abs: 5.2 10*3/uL (ref 1.7–7.7)
Neutrophils Relative %: 60 %
Platelets: 326 10*3/uL (ref 150–400)
RBC: 4.23 MIL/uL (ref 3.87–5.11)
RDW: 13.2 % (ref 11.5–15.5)
WBC: 8.8 10*3/uL (ref 4.0–10.5)
nRBC: 0 % (ref 0.0–0.2)

## 2021-01-19 LAB — BASIC METABOLIC PANEL
Anion gap: 6 (ref 5–15)
BUN: 12 mg/dL (ref 6–20)
CO2: 25 mmol/L (ref 22–32)
Calcium: 8.5 mg/dL — ABNORMAL LOW (ref 8.9–10.3)
Chloride: 106 mmol/L (ref 98–111)
Creatinine, Ser: 0.7 mg/dL (ref 0.44–1.00)
GFR, Estimated: >60 mL/min (ref >60)
Glucose, Bld: 102 mg/dL — ABNORMAL HIGH (ref 70–99)
Potassium: 3.6 mmol/L (ref 3.5–5.1)
Sodium: 137 mmol/L (ref 135–145)

## 2021-01-19 LAB — HCG, QUANTITATIVE, PREGNANCY: hCG, Beta Chain, Quant, S: 7968 m[IU]/mL — ABNORMAL HIGH (ref <5)

## 2021-01-19 LAB — TYPE AND SCREEN
ABO/RH(D): A POS
Antibody Screen: NEGATIVE

## 2021-01-19 LAB — ACETAMINOPHEN LEVEL: Acetaminophen (Tylenol), Serum: 11 ug/mL (ref 10–30)

## 2021-01-19 MED ORDER — SODIUM CHLORIDE 0.9 % IV BOLUS (SEPSIS)
1000.0000 mL | Freq: Once | INTRAVENOUS | Status: AC
  Administered 2021-01-19: 1000 mL via INTRAVENOUS

## 2021-01-19 MED ORDER — KETOROLAC TROMETHAMINE 30 MG/ML IJ SOLN
30.0000 mg | Freq: Once | INTRAMUSCULAR | Status: AC
  Administered 2021-01-19: 30 mg via INTRAVENOUS
  Filled 2021-01-19: qty 1

## 2021-01-19 MED ORDER — ONDANSETRON HCL 4 MG/2ML IJ SOLN
4.0000 mg | Freq: Once | INTRAMUSCULAR | Status: AC
  Administered 2021-01-19: 4 mg via INTRAVENOUS
  Filled 2021-01-19: qty 2

## 2021-01-19 MED ORDER — FENTANYL CITRATE (PF) 100 MCG/2ML IJ SOLN
50.0000 ug | Freq: Once | INTRAMUSCULAR | Status: AC
Start: 2021-01-19 — End: 2021-01-19
  Administered 2021-01-19: 50 ug via INTRAVENOUS
  Filled 2021-01-19: qty 2

## 2021-01-19 NOTE — Discharge Instructions (Signed)
Based on her ultrasound, you have passed all fetal material from the uterus.  Your bleeding should decrease in severity over the next few days as your beta hCG trends to 0.  Please follow-up with your OB/GYN in the next 1-3 days for a recheck of the beta hCG to ensure that it is continuing to decrease.

## 2021-01-19 NOTE — ED Notes (Signed)
Patient transported to Ultrasound 

## 2021-01-19 NOTE — ED Triage Notes (Signed)
Pt arrived via EMS and gives history of a mischarage on Thursday and last night at 2330 pt took 800mg  of misoprostol. Pt states within 30 min she had a large amount of bleeding. Pt is a good historian. G1P0 was approx [redacted] weeks pregnant at time of Thursdays 04-01-1981

## 2021-01-19 NOTE — ED Notes (Signed)
Pt signed physical discharge form and was wheeled to lobby.
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
## 2021-01-20 ENCOUNTER — Telehealth: Payer: Self-pay | Admitting: Certified Nurse Midwife

## 2021-01-20 ENCOUNTER — Other Ambulatory Visit: Payer: Self-pay

## 2021-01-20 NOTE — Telephone Encounter (Signed)
Patient called in crying stating she was out of her pain pills and stated she was still in a lot of pain and wanted to know if she could please get more.  Confirmed with patient, pharmacy is [facility] [place].  Please advise.

## 2021-01-21 ENCOUNTER — Other Ambulatory Visit: Payer: Self-pay

## 2021-01-21 ENCOUNTER — Encounter: Payer: Medicaid Other | Admitting: Certified Nurse Midwife

## 2021-01-21 ENCOUNTER — Ambulatory Visit (INDEPENDENT_AMBULATORY_CARE_PROVIDER_SITE_OTHER): Payer: Self-pay | Admitting: Certified Nurse Midwife

## 2021-01-21 ENCOUNTER — Encounter: Payer: Self-pay | Admitting: Certified Nurse Midwife

## 2021-01-21 VITALS — BP 116/77 | HR 61 | Wt 191.8 lb

## 2021-01-21 DIAGNOSIS — O021 Missed abortion: Secondary | ICD-10-CM

## 2021-01-21 MED ORDER — IBUPROFEN 600 MG PO TABS: 600.0000 mg | ORAL_TABLET | Freq: Four times a day (QID) | ORAL | 0 refills | Status: DC | PRN

## 2021-01-21 MED ORDER — OXYCODONE-ACETAMINOPHEN 5-325 MG PO TABS: 1.0000 | ORAL_TABLET | Freq: Four times a day (QID) | ORAL | 0 refills | Status: DC | PRN

## 2021-01-21 NOTE — Progress Notes (Signed)
GYN ENCOUNTER NOTE  Subjective:       Shannon Alvarado is a 25 y.o. G45P0010 female here for follow up after using cytotec at home for management of missed abortion.   Reports abdominal cramping (8/10), worse than menses, nightly.   Last seen in office on 01/14/2021; for further details, please see previous note.   Seen in emergency room on 01/19/2021 and treated for dehydration.   Denies difficulty breathing or respiratory distress, chest pain, abdominal pain, excessive vaginal bleeding, dysuria, and leg pain or swelling.    Gynecologic History  Patient's last menstrual period was 11/01/2020 (exact date).  Contraception: none  Last Pap: 09/2018. Results were: normal  Obstetric History  OB History  Gravida Para Term Preterm AB Living  1       1    SAB IAB Ectopic Multiple Live Births  1            # Outcome Date GA Lbr Len/2nd Weight Sex Delivery Anes PTL Lv  1 SAB 12/30/20 [redacted]w[redacted]d           Past Medical History:  Diagnosis Date  . Asthma   . Irregular periods   . Obesity     History reviewed. No pertinent surgical history.  Current Outpatient Medications on File Prior to Visit  Medication Sig Dispense Refill  . albuterol (VENTOLIN HFA) 108 (90 Base) MCG/ACT inhaler Inhale 2 puffs into the lungs every 6 (six) hours as needed for wheezing or shortness of breath. 8 g 2  . oxyCODONE-acetaminophen (PERCOCET/ROXICET) 5-325 MG tablet Take 1-2 tablets by mouth every 6 (six) hours as needed. 20 tablet 0  . misoprostol (CYTOTEC) 200 MCG tablet Take 4 tablets (800 mcg total) by mouth once for 1 dose. May repeat in 48 hours if bleeding does not stat 4 tablet 1   No current facility-administered medications on file prior to visit.    No Known Allergies  Social History   Socioeconomic History  . Marital status: Single    Spouse name: Not on file  . Number of children: Not on file  . Years of education: Not on file  . Highest education level: Not on file  Occupational  History  . Not on file  Tobacco Use  . Smoking status: Never Smoker  . Smokeless tobacco: Never Used  Vaping Use  . Vaping Use: Never used  Substance and Sexual Activity  . Alcohol use: No  . Drug use: No  . Sexual activity: Yes  Other Topics Concern  . Not on file  Social History Narrative  . Not on file   Social Determinants of Health   Financial Resource Strain: Not on file  Food Insecurity: Not on file  Transportation Needs: Not on file  Physical Activity: Not on file  Stress: Not on file  Social Connections: Not on file  Intimate Partner Violence: Not on file    Family History  Problem Relation Age of Onset  . Cancer Mother        ovarian  . Ovarian cancer Mother   . Cancer Maternal Aunt        breast  . Breast cancer Maternal Aunt   . Hyperlipidemia Father   . Hypertension Father     The following portions of the patient's history were reviewed and updated as appropriate: allergies, current medications, past family history, past medical history, past social history, past surgical history and problem list.  Review of Systems  ROS negative except as noted above. Information  obtained from patient.   Objective:   BP 116/77   Pulse 61   Wt 191 lb 12.8 oz (87 kg)   LMP 11/01/2020 (Exact Date)   Breastfeeding No   BMI 35.08 kg/m    CONSTITUTIONAL: Well-developed, well-nourished female in no acute distress.   ABDOMEN: Soft, non distended; Non tender.  No Organomegaly.  PELVIC:  External Genitalia: Normal  BUS: Normal  Vagina: Normal, small amount of dark red blood present  Cervix: Normal, small amount of red blood present at os   MUSCULOSKELETAL: Normal range of motion. No tenderness.  No cyanosis, clubbing, or edema.    EXAM: OBSTETRIC <14 WK [place] AND TRANSVAGINAL OB US  CLINICAL DATA:  Vaginal bleeding with positive beta HCG evaluation.  TECHNIQUE: Both transabdominal and transvaginal ultrasound examinations were performed for complete  evaluation of the gestation as well as the maternal uterus, adnexal regions, and pelvic cul-de-sac. Transvaginal technique was performed to assess early pregnancy.  COMPARISON:  January 11, 2021  FINDINGS: Intrauterine gestational sac: Not visualized  Yolk sac:  Not visualized  Embryo:  Not visualized  Cardiac Activity: Not visualized  Subchorionic hemorrhage:  None visualized.  Maternal uterus/adnexae: Cervical os closed. Endometrium is thickened with somewhat complex echotexture within the endometrium. Left ovary measures 1.9 x 1.2 x 2.1 cm. No left-sided extrauterine pelvic mass. Right ovary cannot be visualized by either transabdominal transvaginal technique. No right-sided pelvic mass evident. No free pelvic fluid.  IMPRESSION: Gestational sac no longer seen. Appearance felt to be most indicative of recent spontaneous abortion. The endometrium is thickened with complex echotexture, likely hemorrhage within the endometrium. Given this circumstance, follow of beta hCG to 0 strongly advised to exclude possible retained products of conception.  Right ovary not visualized. Left ovary appears unremarkable. No extrauterine pelvic mass or fluid evident.   Electronically Signed   By: Bretta Bang III M.D.   On: 01/19/2021 08:18   Assessment:   1. Missed abortion  - Beta hCG quant (ref lab) - CBC  Plan:   Rx: Percocet and Motrin, see orders.   Labs today, see chart.   Reviewed red flag symptoms and when to call.   RTC x 3 months for follow up or sooner if needed.    Serafina Royals, CNM [facility], [facility] 01/21/21 5:00 PM

## 2021-01-21 NOTE — Telephone Encounter (Signed)
Pt app today with JML.

## 2021-01-21 NOTE — Patient Instructions (Addendum)
Ibuprofen Oral Tablets and Capsules What is this medicine? IBUPROFEN (eye BYOO proe fen) is a non-steroidal anti-inflammatory drug, also known as an NSAID. It treats pain, inflammation, and swelling. It also reduces fever and minor aches and pains caused by the cold, flu, or a sore throat. This medicine may be used for other purposes; ask your health care provider or pharmacist if you have questions. COMMON BRAND NAME(S): Advil, Advil Junior Strength, Advil Migraine, Genpril, Ibren, IBU, Ibupak, Midol, Midol Cramps and Body Aches, Motrin, Motrin IB, Motrin Junior Strength, Motrin Migraine Pain, Samson-8, Toxicology Saliva Collection What should I tell my health care provider before I take this medicine? They need to know if you have any of these conditions:  bleeding disorder  coronary artery bypass graft (CABG) within the past 2 weeks  dehydration  diarrhea  heart attack  heart disease  heart failure  high blood pressure  if you often drink alcohol  kidney disease  liver disease  lung or breathing disease (asthma)  receiving steroids like dexamethasone or prednisone  smoke tobacco cigarettes  stomach bleeding  stomach ulcers, other stomach or intestine problems  stroke  take drugs that treat or prevent blood clots  vomiting  an unusual or allergic reaction to ibuprofen, other medicines, foods, dyes, or preservatives  pregnant or trying to get pregnant  breast-feeding How should I use this medicine? Take this drug by mouth. Take it as directed on the label. You can take it with or without food. If it upsets your stomach, take it with food. Talk to your health care provider about the use of this drug in children. While it may be prescribed for children as young as 12 for selected conditions, precautions do apply. Patients over 29 years of age may have a stronger reaction and need a smaller dose. If you get this drug as a prescription, a special MedGuide will be  given to you by the pharmacist with each prescription and refill. Be sure to read this information carefully each time. Overdosage: If you think you have taken too much of this medicine contact a poison control center or emergency room at once. NOTE: This medicine is only for you. Do not share this medicine with others. What if I miss a dose? If you take this drug on a regular basis, take it as soon as you can. If it is almost time for your next dose, take only that dose. Do not take double or extra doses. What may interact with this medicine? Do not take this medicine with any of the following medications:  cidofovir  ketorolac  methotrexate  pemetrexed This medicine may also interact with the following medications:  alcohol  aspirin  diuretics  lithium  other drugs for inflammation like prednisone  warfarin This list may not describe all possible interactions. Give your health care provider a list of all the medicines, herbs, non-prescription drugs, or dietary supplements you use. Also tell them if you smoke, drink alcohol, or use illegal drugs. Some items may interact with your medicine. What should I watch for while using this medicine? Visit your health care provider for regular checks on your progress. Tell your health care provider if your symptoms do not start to get better or if they get worse. A painful sore throat or sore throat with high fevers, headaches, nausea, or vomiting may be signs of a serious infection. Call your health care provider if these symptoms occur. Do not use this medicine for more than 2 days  or give to children under 25 years of age unless your health care provider tells you to. Do not take other medicines that contain aspirin, ibuprofen, or naproxen with this medicine. Side effects such as stomach upset, nausea, or ulcers may be more likely to occur. Many non-prescription medicines contain aspirin, ibuprofen, or naproxen. Always read labels  carefully. This medicine can cause serious ulcers and bleeding in the stomach. It can happen with no warning. Smoking, drinking alcohol, older age, and poor health can also increase risks. Call your health care provider right away if you have stomach pain or blood in your vomit or stool. This medicine does not prevent a heart attack or stroke. This medicine may increase the chance of a heart attack or stroke. The chance may increase the longer you use this medicine or if you have heart disease. If you take aspirin to prevent a heart attack or stroke, talk to your health care provider about using this medicine. Alcohol may interfere with the effect of this medicine. Avoid alcoholic drinks. This medicine may cause serious skin reactions. They can happen weeks to months after starting the medicine. Contact your health care provider right away if you notice fevers or flu-like symptoms with a rash. The rash may be red or purple and then turn into blisters or peeling of the skin. Or, you might notice a red rash with swelling of the face, lips or lymph nodes in your neck or under your arms. Talk to your health care provider if you are pregnant before taking this medicine. Taking this medicine between weeks 20 and 30 of pregnancy may harm your unborn baby. Your health care provider will monitor you closely if you need to take it. After 30 weeks of pregnancy, do not take this medicine. You may get drowsy or dizzy. Do not drive, use machinery, or do anything that needs mental alertness until you know how this medicine affects you. Do not stand up or sit up quickly, especially if you are an older patient. This reduces the risk of dizzy or fainting spells. Be careful brushing or flossing your teeth or using a toothpick because you may get an infection or bleed more easily. If you have any dental work done, tell your dentist you are receiving this medicine. This medicine may make it more difficult to get pregnant. Talk  to your health care provider if you are concerned about your fertility. What side effects may I notice from receiving this medicine? Side effects that you should report to your doctor or health care provider as soon as possible:  allergic reactions (skin rash, itching or hives; swelling of the face, lips, or tongue)  aseptic meningitis (stiff neck; sensitivity to light; headache; drowsiness; fever; nausea, vomiting; rash)  bleeding (bloody or black, tarry stools; red or dark brown urine; spitting up blood or brown material that looks like coffee grounds; red spots on the skin; unusual bruising or bleeding from the eyes, gums, or nose)  blurred vision OR changes in vision  heart attack (trouble breathing; pain or tightness in the chest, neck, back or arms; unusually weak or tired)  heart failure (trouble breathing; fast, irregular heartbeat; sudden weight gain; swelling of the ankles, feet, hands; unusually weak or tired)  high potassium levels (chest pain; fast, irregular heartbeat; muscle weakness)  increase in blood pressure  infection (fever, chills, cough, sore throat, pain or trouble passing urine)  kidney injury (trouble passing urine or change in the amount of urine)  liver injury (  dark yellow or brown urine; general ill feeling or flu-like symptoms; loss of appetite, right upper belly pain; unusually weak or tired, yellowing of the eyes or skin)  low blood pressure (dizziness; feeling faint or lightheaded, falls; unusually weak or tired)  low red blood cell counts (trouble breathing; feeling faint; lightheaded, falls; unusually weak or tired)  rash, fever, and swollen lymph nodes  redness, blistering, peeling, or loosening of the skin, including inside the mouth  stroke (changes in vision; confusion; trouble speaking or understanding; severe headaches; sudden numbness or weakness of the face, arm or leg; trouble walking; dizziness; loss of balance or coordination) Side  effects that usually do not require medical attention (report to your doctor or health care provider if they continue or are bothersome):  cough  constipation  diarrhea  dizziness  headache  upset stomach  vomiting This list may not describe all possible side effects. Call your doctor for medical advice about side effects. You may report side effects to FDA at 1-800-FDA-1088. Where should I keep my medicine? Keep out of the reach of children and pets. Store at room temperature between 20 and 25 degrees C (68 and 77 degrees F). Get rid of any unused medicine after the expiration date. To get rid of medicines that are no longer needed or have expired:  Take the medicine to a medicine take-back program. Check with your pharmacy or law enforcement to find a location.  If you cannot return the medicine, check the label or package insert to see if the medicine should be thrown out in the garbage or flushed down the toilet. If you are not sure, ask your health care provider. If it is safe to put it in the trash, empty the medicine out of the container. Mix the medicine with cat litter, dirt, coffee grounds, or other unwanted substance. Seal the mixture in a bag or container. Put it in the trash. NOTE: This sheet is a summary. It may not cover all possible information. If you have questions about this medicine, talk to your doctor, pharmacist, or health care provider.  2021 Elsevier/Gold Standard (2020-03-13 12:06:36)   Miscarriage A miscarriage is the loss of a pregnancy before the 20th week of pregnancy. Sometimes, a pregnancy ends before a woman knows that she is pregnant. If you lose a pregnancy, talk with your doctor about:  Questions you have about the loss of your baby.  How to work through your grief.  Plans for future pregnancy. What are the causes? Many times, the cause of this condition is not known. What increases the risk? These things may make a pregnant woman more  likely to lose a pregnancy: Certain health conditions  Conditions that affect hormones, such as: ? Thyroid disease. ? Polycystic ovary syndrome.  Diabetes.  A disease that causes the body's disease-fighting system to attack itself by mistake.  Infections.  Bleeding problems.  Being very overweight. Lifestyle factors  Using products that have tobacco or nicotine in them.  Being around tobacco smoke.  Having alcohol.  Having a lot of caffeine.  Using drugs. Problems with reproductive organs or parts  Having a cervix that opens and thins before your due date. The cervix is the lowest part of your womb.  Having Asherman syndrome, which leads to: ? Scars in the womb. ? The womb being abnormal in shape.  Growths (fibroids) in the womb.  Problems in the body that are present at birth.  Infection of the cervix or womb. Personal or  health history  Injury.  Having lost a pregnancy before.  Being younger than age 66 or older than age 29.  Being around a harmful substance, such as radiation.  Having lead or other heavy metals in: ? Things you eat or drink. ? The air around you.  Using certain medicines. What are the signs or symptoms?  Blood or spots of blood coming from the vagina. You may also have cramps or pain.  Pain or cramps in the belly or low back.  Fluid or tissue coming out of the vagina. How is this treated? Sometimes, treatment is not needed. If you need treatment, you may be treated with:  A procedure to open the cervix more and take tissue out of the womb.  Medicines. You may get a shot of medicine called Rho(D) immune globulin. Follow these instructions at home: Medicines  Take over-the-counter and prescription medicines only as told by your doctor.  If you were prescribed antibiotic medicine, take it as told by your doctor. Do not stop taking it even if you start to feel better. Activity  Rest as told by your doctor. Ask your doctor  what activities are safe for you.  Have someone help you at home during this time. General instructions  Watch how much tissue comes out of the vagina.  Watch the size of any blood clots that come out of the vagina.  Do not have sex or douche until your doctor says it is okay.  Do not put things, such as tampons, in your vagina until your doctor says it is okay.  To help you and your partner with grieving: ? Talk with your doctor. ? See a Veterinary surgeon.  When you are ready, talk with your doctor about: ? Things to do for your health. ? How you can be healthy if you get pregnant again.  Keep all follow-up visits.   Where to find more information  The [employer] of [employer]stetricians and Gynecologists: acog.org  U.S. Department of Health and Cytogeneticist of Women's Health: http://hoffman.com/ Contact a doctor if:  You have a fever or chills.  There is bad-smelling fluid coming from the vagina.  You have more bleeding.  Tissue or clots of blood come out of your vagina. Get help right away if:  You have very bad cramps or pain in your back or belly.  You soak more than 2 large pads in an hour for more than 2 hours.  You get light-headed or weak.  You faint.  You feel sad, and you have sad thoughts a lot of the time.  You think about hurting yourself. Get help right awayif you feel like you may hurt yourself or others, or have thoughts about taking your own life. Go to your nearest emergency room or:  Call your local emergency services (911 in the [place]  Call the National Suicide Prevention Lifeline at 8145607268. This is open 24 hours a day.  Text the Crisis Text Line at 434 567 0961. Summary  A miscarriage is the loss of a pregnancy before the 20th week of pregnancy. Sometimes, a pregnancy ends before a woman knows that she is pregnant.  Follow instructions from your doctor about medicines and activity.  To help you and your partner with  grieving, talk with your doctor or a counselor.  Keep all follow-up visits. This information is not intended to replace advice given to you by your health care provider. Make sure you discuss any questions you have with your health care  provider. Document Revised: 04/17/2020 Document Reviewed: 04/17/2020 Elsevier Patient Education  2021 [employer].

## 2021-01-22 ENCOUNTER — Other Ambulatory Visit: Payer: Self-pay | Admitting: Certified Nurse Midwife

## 2021-01-22 DIAGNOSIS — Z8759 Personal history of other complications of pregnancy, childbirth and the puerperium: Secondary | ICD-10-CM

## 2021-01-22 LAB — CBC
Hematocrit: 33.4 % — ABNORMAL LOW (ref 34.0–46.6)
Hemoglobin: 10.9 g/dL — ABNORMAL LOW (ref 11.1–15.9)
MCH: 26.8 pg (ref 26.6–33.0)
MCHC: 32.6 g/dL (ref 31.5–35.7)
MCV: 82 fL (ref 79–97)
Platelets: 287 10*3/uL (ref 150–450)
RBC: 4.07 x10E6/uL (ref 3.77–5.28)
RDW: 13.3 % (ref 11.7–15.4)
WBC: 4.6 10*3/uL (ref 3.4–10.8)

## 2021-01-22 LAB — BETA HCG QUANT (REF LAB): hCG Quant: 2199 m[IU]/mL

## 2021-01-22 NOTE — Progress Notes (Signed)
Standing beta ordered, see chart.    Shannon Alvarado, CNM [facility], [facility] 01/22/21 1:58 PM

## 2021-02-11 ENCOUNTER — Other Ambulatory Visit: Payer: Self-pay | Admitting: Certified Nurse Midwife

## 2021-04-23 ENCOUNTER — Encounter: Payer: Medicaid Other | Admitting: Certified Nurse Midwife

## 2021-05-07 ENCOUNTER — Encounter: Payer: Self-pay | Admitting: Certified Nurse Midwife

## 2021-10-31 NOTE — L&D Delivery Note (Signed)
Obstetrical Delivery Note   Date of Delivery:   10/06/2022 Primary OB:   [facility]  Gestational Age/EDD: [redacted]w[redacted]d (Dated by Skip Estimable) Reason for Admission: Labor Antepartum complications: limited prenatal care, BMI 37, unprescribed Percocet use,    Delivered By:   Siri Cole, CNM   Delivery Type:   spontaneous vaginal delivery  Delivery Details:   Pt arrived in early labor, unsure if her water brook.  Was given a labor epidural, PCN started for GBS unknown.  AROM for clear at 1008. Pt was 9cm and then anterior lip for some time, Pitocin augmentation started, Pt found to be  complete after reducing a lip at 1454.  She began pushing then.  Fetal heart rate to the 80's with pushing, Dr Valentino Saxon called for assessment.  Fetal tracing reassuring throughout the remainder of pushing. SVB of viable female at 1532, infant birthed OA with a compound hand followed by body.  Short cord noted, infant placed on mother's lower abd, spontaneous cry, HR > 100, pink by 5 mins. Cord clamped and cut once pulsation ceased. By 40 mins placenta not delivered, bladder straight cathed for yellow urine, attempted manual extraction but unable to reach top of the fundus.  At 1647 large gush of blood and cord lengthening noted followed by delivery of the placenta with some maternal effort. On inspection of the perineum a second degree laceration was found and repaired with 3-0 Vicryl. Infant skin to skin. Both mom and infant stable.  Anesthesia:    local and epidural Intrapartum complications: None GBS:    Negative Laceration:    2nd degree Episiotomy:    none Rectal exam:   Deferred  Placenta:    Delivered and expressed via active management. Intact: yes. To pathology: no.  Delayed Cord Clamping: yes Estimated Blood Loss:   Baby:    Liveborn female, APGARs 9/9, weight 8lbs   Carie Caddy, [place]  [facility]  10/06/2022 6:02 PM

## 2021-10-31 NOTE — L&D Delivery Note (Signed)
On admission Pt admitted to Percent use in Pregnancy. Once pt's family left the room, additional questions asked.  States she was experiencing back pain, would take up to 4 percocet a day, these were not prescribed to her, states she would "just get them".  She did try to stop using the Percocet but was concerned about withdrawal in the fetus. So she continued with taking Percocet.  When asked if she saw a provider about her back pain , she states she is scared of doctors so did not see anybody.  TOC consult placed Her mother and partner are unaware of her Percocet use.  Carie Caddy, CNM   [facility]  10/06/2022  6:16 PM

## 2021-11-14 ENCOUNTER — Encounter: Payer: Self-pay | Admitting: Emergency Medicine

## 2021-11-14 ENCOUNTER — Other Ambulatory Visit: Payer: Self-pay

## 2021-11-14 DIAGNOSIS — Z3A00 Weeks of gestation of pregnancy not specified: Secondary | ICD-10-CM | POA: Insufficient documentation

## 2021-11-14 DIAGNOSIS — N9489 Other specified conditions associated with female genital organs and menstrual cycle: Secondary | ICD-10-CM | POA: Insufficient documentation

## 2021-11-14 DIAGNOSIS — J45909 Unspecified asthma, uncomplicated: Secondary | ICD-10-CM | POA: Insufficient documentation

## 2021-11-14 DIAGNOSIS — O2340 Unspecified infection of urinary tract in pregnancy, unspecified trimester: Secondary | ICD-10-CM | POA: Insufficient documentation

## 2021-11-14 LAB — COMPREHENSIVE METABOLIC PANEL
ALT: 13 U/L (ref 0–44)
AST: 20 U/L (ref 15–41)
Albumin: 3.8 g/dL (ref 3.5–5.0)
Alkaline Phosphatase: 72 U/L (ref 38–126)
Anion gap: 7 (ref 5–15)
BUN: 10 mg/dL (ref 6–20)
CO2: 25 mmol/L (ref 22–32)
Calcium: 9.1 mg/dL (ref 8.9–10.3)
Chloride: 106 mmol/L (ref 98–111)
Creatinine, Ser: 0.78 mg/dL (ref 0.44–1.00)
GFR, Estimated: >60 mL/min (ref >60)
Glucose, Bld: 125 mg/dL — ABNORMAL HIGH (ref 70–99)
Potassium: 3.5 mmol/L (ref 3.5–5.1)
Sodium: 138 mmol/L (ref 135–145)
Total Bilirubin: 0.4 mg/dL (ref 0.3–1.2)
Total Protein: 7.4 g/dL (ref 6.5–8.1)

## 2021-11-14 LAB — CBC WITH DIFFERENTIAL/PLATELET
Abs Immature Granulocytes: 0.01 10*3/uL (ref 0.00–0.07)
Basophils Absolute: 0 10*3/uL (ref 0.0–0.1)
Basophils Relative: 0 %
Eosinophils Absolute: 0.1 10*3/uL (ref 0.0–0.5)
Eosinophils Relative: 1 %
HCT: 36.1 % (ref 36.0–46.0)
Hemoglobin: 11.2 g/dL — ABNORMAL LOW (ref 12.0–15.0)
Immature Granulocytes: 0 %
Lymphocytes Relative: 41 %
Lymphs Abs: 3.7 10*3/uL (ref 0.7–4.0)
MCH: 25 pg — ABNORMAL LOW (ref 26.0–34.0)
MCHC: 31 g/dL (ref 30.0–36.0)
MCV: 80.6 fL (ref 80.0–100.0)
Monocytes Absolute: 0.6 10*3/uL (ref 0.1–1.0)
Monocytes Relative: 7 %
Neutro Abs: 4.6 10*3/uL (ref 1.7–7.7)
Neutrophils Relative %: 51 %
Platelets: 390 10*3/uL (ref 150–400)
RBC: 4.48 MIL/uL (ref 3.87–5.11)
RDW: 14.5 % (ref 11.5–15.5)
WBC: 9 10*3/uL (ref 4.0–10.5)
nRBC: 0 % (ref 0.0–0.2)

## 2021-11-14 LAB — HCG, QUANTITATIVE, PREGNANCY: hCG, Beta Chain, Quant, S: 21 m[IU]/mL — ABNORMAL HIGH (ref <5)

## 2021-11-14 LAB — URINALYSIS, COMPLETE (UACMP) WITH MICROSCOPIC
Bilirubin Urine: NEGATIVE
Glucose, UA: NEGATIVE mg/dL
Leukocytes,Ua: NEGATIVE
Nitrite: NEGATIVE
Protein, ur: 100 mg/dL — AB
Specific Gravity, Urine: >1.03 — ABNORMAL HIGH (ref 1.005–1.030)
WBC, UA: >50 WBC/hpf (ref 0–5)
pH: 6 (ref 5.0–8.0)

## 2021-11-14 LAB — LIPASE, BLOOD: Lipase: 49 U/L (ref 11–51)

## 2021-11-14 LAB — PREGNANCY, URINE: Preg Test, Ur: POSITIVE — AB

## 2021-11-14 NOTE — ED Triage Notes (Signed)
Pt c/o lower abdomen pain with burning during urination and small amounts of urine when she does urinate x 4-5 days.

## 2021-11-14 NOTE — ED Provider Triage Note (Signed)
Emergency Medicine Provider Triage Evaluation Note  Shannon Alvarado , a 26 y.o. female  was evaluated in triage.  Pt complains of dysuria and lower abdominal discomfort for the past 5 days.  Patient does have bilateral low back pain with some nausea but no vomiting.  She denies fever and chills.  No diarrhea.  No similar symptoms in the past.  States that she is in a monogamous relationship with her fianc.  No changes in vaginal discharge.  Review of Systems  Positive: Patient has lower abdominal discomfort and dysuria.  Negative: No chest pain, chest tightness or shortness of breath.   Physical Exam  BP 129/81 (BP Location: Left Arm)    Pulse 87    Temp 98.9 F (37.2 C) (Oral)    Resp 16    Ht 5\' 1"  (1.549 m)    Wt 82.6 kg    LMP 02/08/2021 (Approximate) Comment: has not had a cycle since miscarriage in April   SpO2 98%    BMI 34.39 kg/m  Gen:   Awake, no distress   Resp:  Normal effort  MSK:   Moves extremities without difficulty   Medical Decision Making  Medically screening exam initiated at 9:39 PM.  Appropriate orders placed.  Shannon Alvarado was informed that the remainder of the evaluation will be completed by another provider, this initial triage assessment does not replace that evaluation, and the importance of remaining in the ED until their evaluation is complete.     Vallarie Mare [place], [place] 11/14/21 2141

## 2021-11-15 ENCOUNTER — Emergency Department
Admission: EM | Admit: 2021-11-15 | Discharge: 2021-11-15 | Disposition: A | Discharge status: Home / Self Care | Payer: Self-pay | Attending: Emergency Medicine | Admitting: Emergency Medicine

## 2021-11-15 ENCOUNTER — Emergency Department: Payer: Self-pay

## 2021-11-15 DIAGNOSIS — N390 Urinary tract infection, site not specified: Secondary | ICD-10-CM

## 2021-11-15 DIAGNOSIS — Z3490 Encounter for supervision of normal pregnancy, unspecified, unspecified trimester: Secondary | ICD-10-CM

## 2021-11-15 DIAGNOSIS — R102 Pelvic and perineal pain: Secondary | ICD-10-CM

## 2021-11-15 MED ORDER — CEPHALEXIN 500 MG PO CAPS: 500.0000 mg | ORAL_CAPSULE | Freq: Two times a day (BID) | ORAL | 0 refills | Status: DC

## 2021-11-15 MED ORDER — CEPHALEXIN 500 MG PO CAPS
500.0000 mg | ORAL_CAPSULE | Freq: Once | ORAL | Status: AC
Start: 2021-11-15 — End: 2021-11-15
  Administered 2021-11-15: 500 mg via ORAL
  Filled 2021-11-15: qty 1

## 2021-11-15 MED ORDER — ACETAMINOPHEN 500 MG PO TABS
1000.0000 mg | ORAL_TABLET | Freq: Once | ORAL | Status: AC
  Administered 2021-11-15: 1000 mg via ORAL
  Filled 2021-11-15: qty 2

## 2021-11-15 NOTE — ED Provider Notes (Signed)
[facility] Provider Note    Event Date/Time   First MD Initiated Contact with Patient 11/15/21 0010     (approximate)   History   Urinary Tract Infection   HPI  Shannon Alvarado is a 26 y.o. female with history of asthma, obesity who presents to the emergency department complaints of lower abdominal pain mostly in the suprapubic area that started about 5 to 6 days ago.  She was concerned that she has a UTI as she has had some dysuria but no hematuria.  States she did have some vaginal spotting yesterday that has resolved.  No abnormal vaginal discharge.  Did have nausea and vomiting this week x1 that has resolved.  No diarrhea.  No fever.  She has had 1 previous pregnancy and had a spontaneous miscarriage and underwent a D&E in April 2022.  Her OB/GYN is with [facility].  States she has not had a period since her miscarriage.  He denies any concern for STDs and declines testing, pelvic exam today.   History provided by patient.      Past Medical History:  Diagnosis Date   Asthma    Irregular periods    Obesity     History reviewed. No pertinent surgical history.  MEDICATIONS:  Prior to Admission medications   Medication Sig Start Date End Date Taking? Authorizing Provider  albuterol (VENTOLIN HFA) 108 (90 Base) MCG/ACT inhaler Inhale 2 puffs into the lungs every 6 (six) hours as needed for wheezing or shortness of breath. 11/01/20   Marlana Salvage, PA  ibuprofen (ADVIL) 600 MG tablet TAKE 1 TABLET (600 MG TOTAL) BY MOUTH EVERY 6 (SIX) HOURS AS NEEDED FOR CRAMPING. 02/16/21   Lawhorn, Lara Mulch, CNM  misoprostol (CYTOTEC) 200 MCG tablet Take 4 tablets (800 mcg total) by mouth once for 1 dose. May repeat in 48 hours if bleeding does not stat 01/14/21 01/14/21  Lawhorn, Lara Mulch, CNM  oxyCODONE-acetaminophen (PERCOCET/ROXICET) 5-325 MG tablet Take 1-2 tablets by mouth every 6 (six) hours as needed for moderate pain or severe pain. 01/21/21    Diona Fanti, CNM    Physical Exam   Triage Vital Signs: ED Triage Vitals  Enc Vitals Group     BP 11/14/21 2133 129/81     Pulse Rate 11/14/21 2133 87     Resp 11/14/21 2133 16     Temp 11/14/21 2133 98.9 F (37.2 C)     Temp Source 11/14/21 2133 Oral     SpO2 11/14/21 2133 98 %     Weight 11/14/21 2132 182 lb (82.6 kg)     Height 11/14/21 2132 5\' 1"  (1.549 m)     Head Circumference --      Peak Flow --      Pain Score 11/14/21 2137 4     Pain Loc --      Pain Edu? --      Excl. in [place]? --     Most recent vital signs: Vitals:   11/14/21 2133 11/15/21 0233  BP: 129/81 120/75  Pulse: 87 90  Resp: 16 18  Temp: 98.9 F (37.2 C)   SpO2: 98%     CONSTITUTIONAL: Alert and oriented and responds appropriately to questions. Well-appearing; well-nourished HEAD: Normocephalic, atraumatic EYES: Conjunctivae clear, pupils appear equal, sclera nonicteric ENT: normal nose; moist mucous membranes NECK: Supple, normal ROM CARD: RRR; S1 and S2 appreciated; no murmurs, no clicks, no rubs, no gallops RESP: Normal chest excursion without splinting or  tachypnea; breath sounds clear and equal bilaterally; no wheezes, no rhonchi, no rales, no hypoxia or respiratory distress, speaking full sentences ABD/GI: Normal bowel sounds; non-distended; soft, slightly tender in the suprapubic area, no rebound, no guarding, no peritoneal signs BACK: The back appears normal EXT: Normal ROM in all joints; no deformity noted, no edema; no cyanosis SKIN: Normal color for age and race; warm; no rash on exposed skin NEURO: Moves all extremities equally, normal speech PSYCH: The patient's mood and manner are appropriate.   ED Results / Procedures / Treatments   LABS: (all labs ordered are listed, but only abnormal results are displayed) Labs Reviewed  CBC WITH DIFFERENTIAL/PLATELET - Abnormal; Notable for the following components:      Result Value   Hemoglobin 11.2 (*)    MCH 25.0 (*)     All other components within normal limits  COMPREHENSIVE METABOLIC PANEL - Abnormal; Notable for the following components:   Glucose, Bld 125 (*)    All other components within normal limits  URINALYSIS, COMPLETE (UACMP) WITH MICROSCOPIC - Abnormal; Notable for the following components:   Specific Gravity, Urine >1.030 (*)    Hgb urine dipstick MODERATE (*)    Ketones, ur TRACE (*)    Protein, ur 100 (*)    Non Squamous Epithelial PRESENT (*)    Bacteria, UA RARE (*)    All other components within normal limits  PREGNANCY, URINE - Abnormal; Notable for the following components:   Preg Test, Ur POSITIVE (*)    All other components within normal limits  HCG, QUANTITATIVE, PREGNANCY - Abnormal; Notable for the following components:   hCG, Beta Chain, Quant, S 21 (*)    All other components within normal limits  URINE CULTURE  LIPASE, BLOOD     EKG:   RADIOLOGY: My personal review and interpretation of imaging: Ultrasound shows no gestational sac.  Normal blood flow to both ovaries.  I have personally reviewed all radiology reports.   US OB LESS THAN 14 WEEKS W/ OB TRANSVAGINAL AND DOPPLER  Result Date: 11/15/2021 CLINICAL DATA:  Abdominal pain. Beta HCG 21. Last menstrual period May 22. EXAM: OBSTETRIC <14 WK [place] AND TRANSVAGINAL OB US DOPPLER ULTRASOUND OF OVARIES TECHNIQUE: Both transabdominal and transvaginal ultrasound examinations were performed for complete evaluation of the gestation as well as the maternal uterus, adnexal regions, and pelvic cul-de-sac. Transvaginal technique was performed to assess early pregnancy. Color and duplex Doppler ultrasound was utilized to evaluate blood flow to the ovaries. COMPARISON:  None FINDINGS: Intrauterine gestational sac: None Yolk sac:  Not Visualized. Embryo:  Not Visualized. Cardiac Activity: Not Visualized. Maternal uterus/adnexae: UNREMARKABLE OVARIES. NORMAL VASCULAR FLOW TO BILATERAL OVARIES. THE LEFT OVARY MEASURES 2.7 X 1.1 X 1.9 CM.  THE RIGHT OVARY MEASURES 1.5 X 0.6 X 0.7 CM. BILATERAL ADNEXA ARE UNREMARKABLE. THE UTERUS IS OTHERWISE UNREMARKABLE. ENDOMETRIAL MEASURES 5 MM. Nonspecific subcentimeter hypoechoic foci within the endometrium. Pulsed Doppler evaluation of both ovaries demonstrates normal appearing low-resistance arterial and venous waveforms. IMPRESSION: Non-localization of the pregnancy on this scan. No intrauterine gestational sac. No abnormal ovarian or adnexal masses. Differential diagnosis includes intrauterine gestation too early to visualize, spontaneous abortion, or occult ectopic gestation. Recommend close clinical follow-up and serial serum beta-HCG monitoring, with repeat obstetric scan as warranted by beta-HCG levels and clinical assessment. Electronically Signed   By: Iven Finn M.D.   On: 11/15/2021 01:49     PROCEDURES:  Critical Care performed: No    Procedures    IMPRESSION /  MDM / ASSESSMENT AND PLAN / ED COURSE  I reviewed the triage vital signs and the nursing notes.    Patient here with abdominal pain.  No menstrual cycle since her recent miscarriage.     DIFFERENTIAL DIAGNOSIS (includes but not limited to):   UTI, pregnancy, miscarriage, ectopic, TOA, torsion.  Doubt appendicitis, colitis, diverticulitis, bowel obstruction.   PLAN: We will obtain labs, urine and transvaginal ultrasound.  Will give Tylenol for pain.   MEDICATIONS GIVEN IN ED: Medications  cephALEXin (KEFLEX) capsule 500 mg (500 mg Oral Given 11/15/21 0059)  acetaminophen (TYLENOL) tablet 1,000 mg (1,000 mg Oral Given 11/15/21 0059)     ED COURSE: Patient's labs show hCG of 21.  Her urine does appear infected versus contaminated.  Culture is pending.  Given she has having urinary symptoms, will treat.  Given her positive pregnancy test and abdominal pain, will obtain a transvaginal ultrasound.    Ultrasound reviewed by myself and radiologist.  No torsion, TOA.  No ectopic seen but also no intrauterine  pregnancy.  We discussed that given her hCG is so low that this could be because the pregnancy is very early on but could also be a miscarriage.  I have recommended that she follow-up with her OB/GYN in 48 hours to have her hCG levels trended.  She again declines any STD screening today.  She has been able to tolerate p.o. here and is well-appearing.  Will discharge with prescription of Keflex.  We discussed bleeding return precautions and stressed the importance of returning to the emergency department if she started having increasing abdominal pain given we could not rule out an ectopic today.  Given she is pregnant, recommended Tylenol only for pain control at this time.  Did not feel that narcotics were indicated and she agrees.   At this time, I do not feel there is any life-threatening condition present. I reviewed all nursing notes, vitals, pertinent previous records.  All labs, EKGs, imaging ordered have been independently reviewed and interpreted by myself.  I have reviewed nursing notes and appropriate previous records.  I feel the patient is safe to be discharged home without further emergent workup and can continue workup as an outpatient as needed. Discussed all findings and treatment plan with patient as well as usual and customary return precautions.  They verbalize understanding and are comfortable with this plan.  Outpatient follow-up has been provided as needed. All questions have been answered.    CONSULTS: No OB/GYN consult needed at this time given abdominal pain is well controlled and she is well-appearing with no obvious signs of ectopic or free fluid in the abdomen.   OUTSIDE RECORDS REVIEWED: Reviewed patient's previous records from OB/GYN.  She had negative chlamydia, gonorrhea in April 2022.  Reviewed records from [facility] where patient underwent D and E by Dr. Kayleen Memos on February 10, 2021.         FINAL CLINICAL IMPRESSION(S) / ED DIAGNOSES   Final diagnoses:  Pelvic pain   Pregnancy, unspecified gestational age  Acute UTI     Rx / DC Orders   ED Discharge Orders          Ordered    cephALEXin (KEFLEX) 500 MG capsule  2 times daily        11/15/21 0203             Note:  This document was prepared using Dragon voice recognition software and may include unintentional dictation errors.   Ganon Demasi, Delice Bison, DO  11/15/21 0847 ° °

## 2021-11-15 NOTE — Discharge Instructions (Addendum)
Your hCG level today was 21.  I recommend that you have this rechecked in 48 hours to see if it is going up or down.  This may be done by your OB/GYN at [facility].  You may take over-the-counter Tylenol 1000 mg as needed for pain.  Please return to the emergency department if you have worsening abdominal pain, fever of 100.4 or higher, vomiting that does not stop, heavy vaginal bleeding where you are soaking through more than 1 pad an hour for more than 2 straight hours, feel like you are going to pass out or do pass out, have chest pain or shortness of breath.

## 2021-11-16 LAB — URINE CULTURE: Culture: >=100000 — AB

## 2022-01-11 ENCOUNTER — Emergency Department
Admission: EM | Admit: 2022-01-11 | Discharge: 2022-01-11 | Disposition: A | Discharge status: Home / Self Care | Payer: Medicaid Other | Attending: Emergency Medicine | Admitting: Emergency Medicine

## 2022-01-11 ENCOUNTER — Other Ambulatory Visit: Payer: Self-pay

## 2022-01-11 DIAGNOSIS — J069 Acute upper respiratory infection, unspecified: Secondary | ICD-10-CM | POA: Insufficient documentation

## 2022-01-11 DIAGNOSIS — Z20822 Contact with and (suspected) exposure to covid-19: Secondary | ICD-10-CM | POA: Insufficient documentation

## 2022-01-11 DIAGNOSIS — H109 Unspecified conjunctivitis: Secondary | ICD-10-CM

## 2022-01-11 DIAGNOSIS — H1089 Other conjunctivitis: Secondary | ICD-10-CM | POA: Insufficient documentation

## 2022-01-11 LAB — RESP PANEL BY RT-PCR (FLU A&B, COVID) ARPGX2
Influenza A by PCR: NEGATIVE
Influenza B by PCR: NEGATIVE
SARS Coronavirus 2 by RT PCR: NEGATIVE

## 2022-01-11 MED ORDER — GENTAMICIN SULFATE 0.3 % OP SOLN: 2.0000 [drp] | Freq: Three times a day (TID) | OPHTHALMIC | 0 refills | Status: AC

## 2022-01-11 MED ORDER — PSEUDOEPH-BROMPHEN-DM 30-2-10 MG/5ML PO SYRP: 5.0000 mL | ORAL_SOLUTION | Freq: Four times a day (QID) | ORAL | 0 refills | Status: DC | PRN

## 2022-01-11 NOTE — Discharge Instructions (Addendum)
Follow-up with your primary care provider or urgent care if any continued problems.  A prescription for Bromfed-DM was sent to the pharmacy as needed for cough and congestion.  The gentamicin ophthalmic solution is 2 drops to your left eye for the next 5 days and if your right eye becomes red begin using it to your right eye as well.  Always remember to wash your hands after touching your eyes to prevent contamination.  You may also take Tylenol or ibuprofen as needed for body aches, headache or fever.  Drink lots of fluids to stay hydrated. ?

## 2022-01-11 NOTE — ED Triage Notes (Signed)
Pt c/o cough with sinus and chest congestion, eye pain with redness for the past 3-4 days. Pt is in NAD ?

## 2022-01-11 NOTE — ED Provider Notes (Signed)
? ?[facility] ?Provider Note ? ? ? Event Date/Time  ? First MD Initiated Contact with Patient 01/11/22 1014   ?  (approximate) ? ? ?History  ? ?URI ? ? ?HPI ? ?Shannon Alvarado is a 26 y.o. female   presents to the ED with complaint of cough, congestion and sore throat.  Patient also has noticed that her left eye has been red for the last 3 to 4 days and has had yellow drainage from it. ? ? ?Physical Exam  ? ?Triage Vital Signs: ?ED Triage Vitals  ?Enc Vitals Group  ?   BP 01/11/22 1000 134/65  ?   Pulse Rate 01/11/22 1000 71  ?   Resp 01/11/22 1000 18  ?   Temp 01/11/22 1000 97.8 ?F (36.6 ?C)  ?   Temp Source 01/11/22 1000 Oral  ?   SpO2 01/11/22 1000 100 %  ?   Weight 01/11/22 1001 174 lb (78.9 kg)  ?   Height 01/11/22 1001 5\' 2"  (1.575 m)  ?   Head Circumference --   ?   Peak Flow --   ?   Pain Score --   ?   Pain Loc --   ?   Pain Edu? --   ?   Excl. in [place]? --   ? ? ?Most recent vital signs: ?Vitals:  ? 01/11/22 1000  ?BP: 134/65  ?Pulse: 71  ?Resp: 18  ?Temp: 97.8 ?F (36.6 ?C)  ?SpO2: 100%  ? ? ? ?General: Awake, no distress.  ?CV:  Good peripheral perfusion.  Heart regular rate and rhythm without murmur. ?Resp:  Normal effort.  Lungs are clear bilaterally. ?Abd:  No distention.  ?Other:  Left conjunctive is injected with yellow thick exudate present.  Right conjunctive is clear at this time.  No erythema noted posterior pharynx or tonsillar enlargement. ? ? ?ED Results / Procedures / Treatments  ? ?Labs ?(all labs ordered are listed, but only abnormal results are displayed) ?Labs Reviewed  ?RESP PANEL BY RT-PCR (FLU A&B, COVID) ARPGX2  ? ? ? ?PROCEDURES: ? ?Critical Care performed:  ? ?Procedures ? ? ?MEDICATIONS ORDERED IN ED: ?Medications - No data to display ? ? ?IMPRESSION / MDM / ASSESSMENT AND PLAN / ED COURSE  ?I reviewed the triage vital signs and the nursing notes. ? ? ?Differential diagnosis includes, but is not limited to, viral upper respiratory infection, influenza, COVID,  conjunctivitis. ? ?26 year old female presents to the ED with complaint of several days cough, congestion and this to the left eye.  Patient denies any other symptoms.  Physical exam is consistent with a viral upper respiratory infection.  Nasal swab was reviewed and COVID/influenza test were negative.  This was shared with the patient.  We also talked about the conjunctivitis she has in her left eye and that this is contagious.  A prescription for gentamicin ophthalmic solution was sent to the pharmacy for her to be in using and she is aware that should her right eye began showing symptoms she should also use in her right eye and wash her hands immediately after touching her eyes.  She is to follow-up with her PCP or urgent care if any continued problems. ? ? ? ? ?FINAL CLINICAL IMPRESSION(S) / ED DIAGNOSES  ? ?Final diagnoses:  ?Viral URI with cough  ?Bacterial conjunctivitis of left eye  ? ? ? ?Rx / DC Orders  ? ?ED Discharge Orders   ? ?      Ordered  ?  gentamicin (GARAMYCIN) 0.3 % ophthalmic solution  3 times daily       ? 01/11/22 1145  ?  brompheniramine-pseudoephedrine-DM 30-2-10 MG/5ML syrup  4 times daily PRN       ? 01/11/22 1145  ? ?  ?  ? ?  ? ? ? ?Note:  This document was prepared using Dragon voice recognition software and may include unintentional dictation errors. ?  ?Tommi Rumps, PA-C ?01/11/22 1150 ? ?  ?Jene Every, MD ?01/11/22 1218 ? ?

## 2022-01-11 NOTE — ED Notes (Signed)
See triage note  presents with sore throat ,congestion and eye irritation  states sxs'  started couple of days ago  afebrile on arrival ?

## 2022-04-29 ENCOUNTER — Ambulatory Visit (INDEPENDENT_AMBULATORY_CARE_PROVIDER_SITE_OTHER): Payer: Self-pay | Admitting: Certified Nurse Midwife

## 2022-04-29 ENCOUNTER — Encounter: Payer: Self-pay | Admitting: Obstetrics

## 2022-04-29 DIAGNOSIS — Z3200 Encounter for pregnancy test, result unknown: Secondary | ICD-10-CM

## 2022-04-29 DIAGNOSIS — Z3687 Encounter for antenatal screening for uncertain dates: Secondary | ICD-10-CM

## 2022-04-29 DIAGNOSIS — Z3201 Encounter for pregnancy test, result positive: Secondary | ICD-10-CM

## 2022-04-29 LAB — POCT URINE PREGNANCY: Preg Test, Ur: POSITIVE — AB

## 2022-04-29 NOTE — Progress Notes (Unsigned)
Pt presents for pregnancy confirmation done on 04/29/22, UPT POS. G2P0010. Pt states she is currently taking PNV. EDD 08/07/22 based on LMP of 10/31/21 (irregular cycles) and patient needs dating u/s for accurate GA, order placed. Pt to schedule f/u nurse visit, labs, etc. with front desk at checkout. All questions answered.

## 2022-05-09 ENCOUNTER — Ambulatory Visit
Admission: RE | Admit: 2022-05-09 | Discharge: 2022-05-09 | Disposition: A | Discharge status: Home / Self Care | Payer: Medicaid Other | Source: Ambulatory Visit | Attending: Certified Nurse Midwife | Admitting: Certified Nurse Midwife

## 2022-05-09 ENCOUNTER — Other Ambulatory Visit: Payer: Medicaid Other

## 2022-05-09 ENCOUNTER — Other Ambulatory Visit: Payer: Self-pay

## 2022-05-09 DIAGNOSIS — Z3A27 27 weeks gestation of pregnancy: Secondary | ICD-10-CM

## 2022-05-09 DIAGNOSIS — O0930 Supervision of pregnancy with insufficient antenatal care, unspecified trimester: Secondary | ICD-10-CM

## 2022-05-09 DIAGNOSIS — Z0283 Encounter for blood-alcohol and blood-drug test: Secondary | ICD-10-CM

## 2022-05-09 DIAGNOSIS — Z113 Encounter for screening for infections with a predominantly sexual mode of transmission: Secondary | ICD-10-CM

## 2022-05-09 DIAGNOSIS — Z3687 Encounter for antenatal screening for uncertain dates: Secondary | ICD-10-CM | POA: Insufficient documentation

## 2022-05-09 DIAGNOSIS — Z1379 Encounter for other screening for genetic and chromosomal anomalies: Secondary | ICD-10-CM

## 2022-05-09 DIAGNOSIS — Z3483 Encounter for supervision of other normal pregnancy, third trimester: Secondary | ICD-10-CM

## 2022-05-10 ENCOUNTER — Telehealth: Payer: Self-pay | Admitting: Obstetrics

## 2022-05-10 ENCOUNTER — Other Ambulatory Visit: Payer: Self-pay | Admitting: Certified Nurse Midwife

## 2022-05-10 ENCOUNTER — Other Ambulatory Visit: Payer: Medicaid Other

## 2022-05-10 ENCOUNTER — Encounter: Payer: Self-pay | Admitting: Certified Nurse Midwife

## 2022-05-10 DIAGNOSIS — O35EXX0 Maternal care for other (suspected) fetal abnormality and damage, fetal genitourinary anomalies, not applicable or unspecified: Secondary | ICD-10-CM

## 2022-05-10 NOTE — Telephone Encounter (Signed)
Pt stated during lab appt today, "she has been fainting 1 hr or so after eating meals". Pt states "this has happened multiple times" Pt states " if she stands up for an extended period of time she can lose her balance and fall". Pt states her Blood Pressure has been low during these times. Pt acknowledges using a BP machine at home to check. Pt is scheduled for NEW OB appt. on 07/17 at 8:15 am.

## 2022-05-11 ENCOUNTER — Other Ambulatory Visit: Payer: Medicaid Other

## 2022-05-11 LAB — HGB SOLU + RFLX FRAC: Sickle Solubility Test - HGBRFX: NEGATIVE

## 2022-05-11 LAB — ABO AND RH: Rh Factor: POSITIVE

## 2022-05-11 LAB — VIRAL HEPATITIS HBV, HCV
HCV Ab: NONREACTIVE
Hep B Core Total Ab: NEGATIVE
Hep B Surface Ab, Qual: NONREACTIVE
Hepatitis B Surface Ag: NEGATIVE

## 2022-05-11 LAB — MICROSCOPIC EXAMINATION
Casts: NONE SEEN /lpf
RBC, Urine: NONE SEEN /hpf (ref 0–2)

## 2022-05-11 LAB — MONITOR DRUG PROFILE 14(MW)
Amphetamine Scrn, Ur: NEGATIVE ng/mL
BARBITURATE SCREEN URINE: NEGATIVE ng/mL
BENZODIAZEPINE SCREEN, URINE: NEGATIVE ng/mL
Buprenorphine, Urine: NEGATIVE ng/mL
CANNABINOIDS UR QL SCN: NEGATIVE ng/mL
Cocaine (Metab) Scrn, Ur: NEGATIVE ng/mL
Creatinine(Crt), U: 39.4 mg/dL (ref 20.0–300.0)
Fentanyl, Urine: NEGATIVE pg/mL
Meperidine Screen, Urine: NEGATIVE ng/mL
Methadone Screen, Urine: NEGATIVE ng/mL
OXYCODONE+OXYMORPHONE UR QL SCN: NEGATIVE ng/mL
Opiate Scrn, Ur: NEGATIVE ng/mL
Ph of Urine: 6.3 (ref 4.5–8.9)
Phencyclidine Qn, Ur: NEGATIVE ng/mL
Propoxyphene Scrn, Ur: NEGATIVE ng/mL
SPECIFIC GRAVITY: 1.008
Tramadol Screen, Urine: NEGATIVE ng/mL

## 2022-05-11 LAB — CBC
Hematocrit: 29.7 % — ABNORMAL LOW (ref 34.0–46.6)
Hemoglobin: 10 g/dL — ABNORMAL LOW (ref 11.1–15.9)
MCH: 26.5 pg — ABNORMAL LOW (ref 26.6–33.0)
MCHC: 33.7 g/dL (ref 31.5–35.7)
MCV: 79 fL (ref 79–97)
Platelets: 361 10*3/uL (ref 150–450)
RBC: 3.77 x10E6/uL (ref 3.77–5.28)
RDW: 13.3 % (ref 11.7–15.4)
WBC: 9.1 10*3/uL (ref 3.4–10.8)

## 2022-05-11 LAB — HCV INTERPRETATION

## 2022-05-11 LAB — NICOTINE SCREEN, URINE: Cotinine Ql Scrn, Ur: NEGATIVE ng/mL

## 2022-05-11 LAB — URINALYSIS, ROUTINE W REFLEX MICROSCOPIC
Bilirubin, UA: NEGATIVE
Glucose, UA: NEGATIVE
Ketones, UA: NEGATIVE
Nitrite, UA: NEGATIVE
Protein,UA: NEGATIVE
RBC, UA: NEGATIVE
Specific Gravity, UA: 1.008 (ref 1.005–1.030)
Urobilinogen, Ur: 0.2 mg/dL (ref 0.2–1.0)
pH, UA: 7 (ref 5.0–7.5)

## 2022-05-11 LAB — TOXOPLASMA ANTIBODIES- IGG AND  IGM
Toxoplasma Antibody- IgM: <3 AU/mL (ref 0.0–7.9)
Toxoplasma IgG Ratio: <3 IU/mL (ref 0.0–7.1)

## 2022-05-11 LAB — VARICELLA ZOSTER ANTIBODY, IGG: Varicella zoster IgG: 511 index (ref >165)

## 2022-05-11 LAB — RUBELLA SCREEN: Rubella Antibodies, IGG: 5.95 index (ref >0.99)

## 2022-05-11 LAB — HIV ANTIBODY (ROUTINE TESTING W REFLEX): HIV Screen 4th Generation wRfx: NONREACTIVE

## 2022-05-11 LAB — ANTIBODY SCREEN: Antibody Screen: NEGATIVE

## 2022-05-11 LAB — GLUCOSE, 1 HOUR GESTATIONAL: Gestational Diabetes Screen: 120 mg/dL (ref 70–139)

## 2022-05-11 LAB — RPR: RPR Ser Ql: NONREACTIVE

## 2022-05-11 NOTE — Telephone Encounter (Signed)
Message relayed to patient, waiting for pt response in regards to providers questions.

## 2022-05-12 ENCOUNTER — Ambulatory Visit (INDEPENDENT_AMBULATORY_CARE_PROVIDER_SITE_OTHER): Payer: Self-pay | Admitting: Obstetrics

## 2022-05-12 ENCOUNTER — Encounter: Payer: Self-pay | Admitting: Obstetrics

## 2022-05-12 VITALS — BP 115/71 | HR 82 | Wt 192.0 lb

## 2022-05-12 DIAGNOSIS — Z3A20 20 weeks gestation of pregnancy: Secondary | ICD-10-CM

## 2022-05-12 DIAGNOSIS — O0932 Supervision of pregnancy with insufficient antenatal care, second trimester: Secondary | ICD-10-CM

## 2022-05-12 DIAGNOSIS — O09292 Supervision of pregnancy with other poor reproductive or obstetric history, second trimester: Secondary | ICD-10-CM

## 2022-05-12 DIAGNOSIS — Z3A27 27 weeks gestation of pregnancy: Secondary | ICD-10-CM

## 2022-05-12 LAB — GC/CHLAMYDIA PROBE AMP
Chlamydia trachomatis, NAA: NEGATIVE
Neisseria Gonorrhoeae by PCR: NEGATIVE

## 2022-05-12 LAB — URINE CULTURE, OB REFLEX

## 2022-05-12 LAB — CULTURE, OB URINE

## 2022-05-12 NOTE — Progress Notes (Addendum)
NEW OB HISTORY AND PHYSICAL  SUBJECTIVE:       Shannon Alvarado is a 26 y.o. G30P0010 female, Patient's last menstrual period was 02/08/2021 (approximate)., Estimated Date of Delivery: 09/29/22, [redacted]w[redacted]d, presents today for establishment of Prenatal Care. She reports round ligament pain, headaches, and heartburn. She has had a few syncopal episodes this pregnancy. Her periods are very irregular and she has had 2 miscarriages. US showed her to be about 20 weeks today. 1-hour glucose had been done based on LMP dates.  Social history Partner/Relationship: FOB will co-parent but they are not in a relationship Living situation: lives with mom. Feels safe. Work: bookstore Exercise: walking at work Substance use: Denies EtOH, tobacco, vape, and MJ. Lives with tobacco and MJ users.   Gynecologic History Patient's last menstrual period was 02/08/2021 (approximate).  Irregular, had a miscarriage in January. Contraception: none Last Pap: 2019. Results were: normal  Obstetric History OB History  Gravida Para Term Preterm AB Living  2       1    SAB IAB Ectopic Multiple Live Births  1            # Outcome Date GA Lbr Len/2nd Weight Sex Delivery Anes PTL Lv  2 Current           1 SAB 12/30/20 [redacted]w[redacted]d           Past Medical History:  Diagnosis Date   Asthma    Irregular periods    Obesity     History reviewed. No pertinent surgical history.  Current Outpatient Medications on File Prior to Visit  Medication Sig Dispense Refill   albuterol (VENTOLIN HFA) 108 (90 Base) MCG/ACT inhaler Inhale 2 puffs into the lungs every 6 (six) hours as needed for wheezing or shortness of breath. 8 g 2   brompheniramine-pseudoephedrine-DM 30-2-10 MG/5ML syrup Take 5 mLs by mouth 4 (four) times daily as needed. 120 mL 0   misoprostol (CYTOTEC) 200 MCG tablet Take 4 tablets (800 mcg total) by mouth once for 1 dose. May repeat in 48 hours if bleeding does not stat 4 tablet 1   No current facility-administered  medications on file prior to visit.    No Known Allergies  Social History   Socioeconomic History   Marital status: Single    Spouse name: Not on file   Number of children: Not on file   Years of education: Not on file   Highest education level: Not on file  Occupational History   Not on file  Tobacco Use   Smoking status: Never   Smokeless tobacco: Never  Vaping Use   Vaping Use: Never used  Substance and Sexual Activity   Alcohol use: No   Drug use: No   Sexual activity: Yes  Other Topics Concern   Not on file  Social History Narrative   Not on file   Social Determinants of Health   Financial Resource Strain: Not on file  Food Insecurity: Not on file  Transportation Needs: Not on file  Physical Activity: Not on file  Stress: Not on file  Social Connections: Not on file  Intimate Partner Violence: Not on file    Family History  Problem Relation Age of Onset   Cancer Mother        ovarian   Ovarian cancer Mother    Cancer Maternal Aunt        breast   Breast cancer Maternal Aunt    Hyperlipidemia Father    Hypertension Father  The following portions of the patient's history were reviewed and updated as appropriate: allergies, current medications, past OB history, past medical history, past surgical history, past family history, past social history, and problem list.  History obtained from the patient General ROS: negative for - chills, fatigue, or fever Psychological ROS: positive for - anxiety negative for - depression Ophthalmic ROS: negative for - blurry vision or decreased vision ENT ROS: positive for - headaches negative for - sinus pain or sore throat Hematological and Lymphatic ROS: positive for - bruising negative for - bleeding problems or swollen lymph nodes Endocrine ROS: negative for - breast changes, hot flashes, or palpitations Breast ROS: negative for breast lumps Respiratory ROS: no cough, shortness of breath, or  wheezing Cardiovascular ROS: no chest pain or dyspnea on exertion Gastrointestinal ROS: no abdominal pain, change in bowel habits, or black or bloody stools Genito-Urinary ROS: no dysuria, trouble voiding, or hematuria Musculoskeletal ROS: negative Dermatological ROS: negative   OBJECTIVE: Initial Physical Exam (New OB)  GENERAL APPEARANCE: alert, well appearing, in no apparent distress, oriented to person, place and time HEAD: normocephalic, atraumatic MOUTH: mucous membranes moist, pharynx normal without lesions THYROID: no thyromegaly or masses present BREASTS: no masses noted, no significant tenderness, no palpable axillary nodes, no skin changes LUNGS: clear to auscultation, no wheezes, rales or rhonchi, symmetric air entry HEART: regular rate and rhythm, no murmurs ABDOMEN: soft, nontender, nondistended, no abnormal masses, no epigastric pain, fundus soft, nontender 20 weeks size, and FHT present (150 bpm) EXTREMITIES: no redness or tenderness in the calves or thighs SKIN: normal coloration and turgor, no rashes LYMPH NODES: no adenopathy palpable NEUROLOGIC: alert, oriented, normal speech, no focal findings or movement disorder noted  PELVIC EXAM deferred  ASSESSMENT: Normal pregnancy 20w  PLAN: Routine prenatal care. We discussed an overview of prenatal care and when to call. Reviewed diet, exercise, and weight gain recommendations in pregnancy. Discussed benefits of breastfeeding and lactation resources at [facility]. Encouraged prenatal vitamin, iron, magnesium, and B12 supplements. Discussed increased hydration, high-protein snacks, compression socks, frequent breaks to reduce syncopal episodes. Instructed to call if they continue or worsen. Prefers to wait until PP for Pap. I reviewed labs and answered all questions. ROB in 4 weeks.  See orders  Guadlupe Spanish, CNM

## 2022-05-13 ENCOUNTER — Telehealth: Payer: Self-pay | Admitting: Certified Nurse Midwife

## 2022-05-13 NOTE — Telephone Encounter (Signed)
Attempted to call pt to make her aware of apt unable to leave message, sent mychart message.

## 2022-05-15 LAB — MATERNIT21  PLUS CORE+ESS+SCA, BLOOD

## 2022-05-16 ENCOUNTER — Encounter: Payer: Medicaid Other | Admitting: Obstetrics

## 2022-06-15 ENCOUNTER — Encounter: Payer: Medicaid Other | Admitting: Certified Nurse Midwife

## 2022-06-16 ENCOUNTER — Other Ambulatory Visit: Payer: Self-pay

## 2022-06-16 ENCOUNTER — Ambulatory Visit: Payer: Medicaid Other | Attending: Maternal & Fetal Medicine

## 2022-06-16 ENCOUNTER — Encounter: Payer: Self-pay | Admitting: Certified Nurse Midwife

## 2022-06-16 DIAGNOSIS — O283 Abnormal ultrasonic finding on antenatal screening of mother: Secondary | ICD-10-CM | POA: Insufficient documentation

## 2022-06-16 DIAGNOSIS — N1330 Unspecified hydronephrosis: Secondary | ICD-10-CM | POA: Insufficient documentation

## 2022-06-16 DIAGNOSIS — Z363 Encounter for antenatal screening for malformations: Secondary | ICD-10-CM | POA: Insufficient documentation

## 2022-06-16 DIAGNOSIS — Z3A25 25 weeks gestation of pregnancy: Secondary | ICD-10-CM | POA: Insufficient documentation

## 2022-06-16 DIAGNOSIS — O35EXX0 Maternal care for other (suspected) fetal abnormality and damage, fetal genitourinary anomalies, not applicable or unspecified: Secondary | ICD-10-CM

## 2022-06-21 ENCOUNTER — Encounter: Payer: Medicaid Other | Admitting: Obstetrics

## 2022-07-07 ENCOUNTER — Encounter: Payer: Medicaid Other | Admitting: Certified Nurse Midwife

## 2022-07-28 ENCOUNTER — Observation Stay
Admission: EM | Admit: 2022-07-28 | Discharge: 2022-07-28 | Disposition: A | Discharge status: Home / Self Care | Payer: Medicaid Other | Attending: Obstetrics and Gynecology | Admitting: Obstetrics and Gynecology

## 2022-07-28 ENCOUNTER — Encounter: Payer: Self-pay | Admitting: Obstetrics and Gynecology

## 2022-07-28 ENCOUNTER — Other Ambulatory Visit: Payer: Self-pay

## 2022-07-28 DIAGNOSIS — Z3490 Encounter for supervision of normal pregnancy, unspecified, unspecified trimester: Secondary | ICD-10-CM

## 2022-07-28 DIAGNOSIS — Z3A31 31 weeks gestation of pregnancy: Secondary | ICD-10-CM | POA: Insufficient documentation

## 2022-07-28 DIAGNOSIS — J45909 Unspecified asthma, uncomplicated: Secondary | ICD-10-CM | POA: Insufficient documentation

## 2022-07-28 DIAGNOSIS — Z79899 Other long term (current) drug therapy: Secondary | ICD-10-CM | POA: Insufficient documentation

## 2022-07-28 DIAGNOSIS — O4693 Antepartum hemorrhage, unspecified, third trimester: Principal | ICD-10-CM | POA: Insufficient documentation

## 2022-07-28 DIAGNOSIS — O99513 Diseases of the respiratory system complicating pregnancy, third trimester: Secondary | ICD-10-CM | POA: Insufficient documentation

## 2022-07-28 LAB — WET PREP, GENITAL
Clue Cells Wet Prep HPF POC: NONE SEEN
Sperm: NONE SEEN
Trich, Wet Prep: NONE SEEN
WBC, Wet Prep HPF POC: <10 — AB (ref <10)
Yeast Wet Prep HPF POC: NONE SEEN

## 2022-07-28 LAB — CHLAMYDIA/NGC RT PCR (ARMC ONLY)
Chlamydia Tr: NOT DETECTED
N gonorrhoeae: NOT DETECTED

## 2022-07-28 NOTE — Progress Notes (Signed)
Discharge instructions provided to pt. Pt verbalizes understanding. Vaginal bleeding and discharge, contractions, and fetal movement reviewed by RN. Follow-up care reviewed and RN discussed that pt needs to call [facility] to set up apt. Pt discharged home with mother .

## 2022-07-28 NOTE — OB Triage Note (Signed)
Pt is a 26yo G3P0020, 31w 5d. She arrived to the unit with complaints of  vaginal bleeding. She denies vaginal bleeding, reports positive fetal movement, and reports no contractions. VS stable, monitors applied and assessing.   Initial FHT 125 at 0318.

## 2022-07-28 NOTE — Discharge Summary (Signed)
Physician Final Progress Note  Patient ID: Shannon Alvarado MRN: [id number] DOB/AGE: 1995/12/10 26 y.o.  Admit date: 07/28/2022 Admitting provider: Hildred Laser, MD Discharge date: 07/28/2022   Admission Diagnoses:  1) intrauterine pregnancy at [redacted]w[redacted]d  2) vaginal bleeding   Discharge Diagnoses:  Active Problems:   * No active hospital problems. *  Vaginal bleeding   History of Present Illness: The patient is a 26 y.o. female G3P0020 at [redacted]w[redacted]d who presents for vaginal bleeding.  She had IC around 2300, when she got up to the BR around 0230 she noticed bright red bleeding with clots. She had some cramping at that time. She endorses +FM. She reports she does experience spotting after IC on a regular basis. She has a hx of miscarriage x 2, she may have had a hemorrhage with one of those  so is very concerned about hemorrhage now.   Past Medical History:  Diagnosis Date   Asthma    Irregular periods    Obesity     History reviewed. No pertinent surgical history.  No current facility-administered medications on file prior to encounter.   Current Outpatient Medications on File Prior to Encounter  Medication Sig Dispense Refill   ferrous sulfate 325 (65 FE) MG tablet Take 325 mg by mouth daily with breakfast.     Prenatal Vit-Fe Fumarate-FA (PRENATAL MULTIVITAMIN) TABS tablet Take 1 tablet by mouth daily at 12 noon.     albuterol (VENTOLIN HFA) 108 (90 Base) MCG/ACT inhaler Inhale 2 puffs into the lungs every 6 (six) hours as needed for wheezing or shortness of breath. (Patient not taking: Reported on 06/16/2022) 8 g 2   brompheniramine-pseudoephedrine-DM 30-2-10 MG/5ML syrup Take 5 mLs by mouth 4 (four) times daily as needed. (Patient not taking: Reported on 06/16/2022) 120 mL 0    No Known Allergies  Social History   Socioeconomic History   Marital status: Single    Spouse name: Not on file   Number of children: Not on file   Years of education: Not on file   Highest education  level: Not on file  Occupational History   Not on file  Tobacco Use   Smoking status: Never   Smokeless tobacco: Never  Vaping Use   Vaping Use: Never used  Substance and Sexual Activity   Alcohol use: No   Drug use: No   Sexual activity: Yes  Other Topics Concern   Not on file  Social History Narrative   Not on file   Social Determinants of Health   Financial Resource Strain: Not on file  Food Insecurity: Not on file  Transportation Needs: Not on file  Physical Activity: Not on file  Stress: Not on file  Social Connections: Not on file  Intimate Partner Violence: Not on file    Family History  Problem Relation Age of Onset   Cancer Mother        ovarian   Ovarian cancer Mother    Cancer Maternal Aunt        breast   Breast cancer Maternal Aunt    Hyperlipidemia Father    Hypertension Father      Review of Systems  Constitutional: Negative.   Respiratory: Negative.    Cardiovascular: Negative.   Gastrointestinal: Negative.   Genitourinary:        Vaginal bleeding   Neurological: Negative.   Psychiatric/Behavioral: Negative.       Physical Exam: BP (!) 124/57   Pulse 81   Temp 98 F (36.7  C) (Oral)   Resp 18   Ht 5\' 3"  (1.6 m)   Wt 91.6 kg   LMP 12/23/2021 Comment: has not had a cycle since miscarriage in April  SpO2 98%   BMI 35.78 kg/m   Physical Exam Constitutional:      Appearance: Normal appearance.  Genitourinary:     Vulva normal.     Genitourinary Comments: SSE: cervix closed, a few cysts present along posterior aspect of cervix present, initially pink tinged discharge present on swab, vaginal discharge removed from cervix with swab followed by bright red bleeding that filled the speculum, pressure applied to the cervix with swab, after a few minutes bleeding stopped.  No active bleeding from OS. Gc/ct and wet prep collected.   Abdominal:     Comments: gravid  Neurological:     Mental Status: She is alert.  Psychiatric:        Mood and  Affect: Mood normal.   EFM: baseline 125, moderate variability, pos accel (15x15) isolated variable   TCCO: no contractions  Consults: None  Significant Findings/ Diagnostic Studies: wet prep negative, gc/ct pending   Procedures: RNST   Hospital Course: The patient was admitted to Labor and Delivery Triage for observation. She had a RNST.  Assessment and tracing reviewed with Dr Marcelline Mates.  Vaginal bleeding most likely caused by the cervix being irritated after IC. Discussed with pt and her family.  Family members seem concerned given her hx of hemorrhage with a miscarriage. Reviewed a hemorrhage from miscarriage is a different event, the pt is currently stable-she is not hemorrhaging.  Pt educated on returning to triage if she continues to have vaginal bleeding.  Recommend abstaining from IC for at least 2 weeks.   Discharge Condition: stable  Disposition: Discharge disposition: 01-Home or Self Care      Pt needs to go to the Medicaid office and then will call schedule ROB as she is due for an apt.  Diet: Regular diet  Discharge Activity: Activity as tolerated   Allergies as of 07/28/2022   No Known Allergies      Medication List     STOP taking these medications    brompheniramine-pseudoephedrine-DM 30-2-10 MG/5ML syrup       TAKE these medications    albuterol 108 (90 Base) MCG/ACT inhaler Commonly known as: VENTOLIN HFA Inhale 2 puffs into the lungs every 6 (six) hours as needed for wheezing or shortness of breath.   ferrous sulfate 325 (65 FE) MG tablet Take 325 mg by mouth daily with breakfast.   prenatal multivitamin Tabs tablet Take 1 tablet by mouth daily at 12 noon.         Total time spent taking care of this patient: 30 minutes  Signed: Jillene Bucks [facility], CNM  07/28/2022, 5:15 AM

## 2022-10-06 ENCOUNTER — Inpatient Hospital Stay: Payer: Medicaid Other | Admitting: Anesthesiology

## 2022-10-06 ENCOUNTER — Encounter: Payer: Self-pay | Admitting: Obstetrics and Gynecology

## 2022-10-06 ENCOUNTER — Inpatient Hospital Stay
Admission: EM | Admit: 2022-10-06 | Discharge: 2022-10-08 | DRG: 807 | Disposition: A | Discharge status: Home / Self Care | Payer: Medicaid Other | Attending: Licensed Practical Nurse | Admitting: Licensed Practical Nurse

## 2022-10-06 ENCOUNTER — Other Ambulatory Visit: Payer: Self-pay

## 2022-10-06 DIAGNOSIS — O99214 Obesity complicating childbirth: Secondary | ICD-10-CM | POA: Diagnosis present

## 2022-10-06 DIAGNOSIS — O99324 Drug use complicating childbirth: Secondary | ICD-10-CM | POA: Diagnosis not present

## 2022-10-06 DIAGNOSIS — F1290 Cannabis use, unspecified, uncomplicated: Secondary | ICD-10-CM | POA: Diagnosis not present

## 2022-10-06 DIAGNOSIS — Z3A41 41 weeks gestation of pregnancy: Secondary | ICD-10-CM

## 2022-10-06 DIAGNOSIS — O403XX0 Polyhydramnios, third trimester, not applicable or unspecified: Secondary | ICD-10-CM | POA: Diagnosis not present

## 2022-10-06 DIAGNOSIS — O0933 Supervision of pregnancy with insufficient antenatal care, third trimester: Secondary | ICD-10-CM

## 2022-10-06 DIAGNOSIS — E669 Obesity, unspecified: Secondary | ICD-10-CM

## 2022-10-06 DIAGNOSIS — O09293 Supervision of pregnancy with other poor reproductive or obstetric history, third trimester: Secondary | ICD-10-CM | POA: Diagnosis not present

## 2022-10-06 DIAGNOSIS — O480 Post-term pregnancy: Secondary | ICD-10-CM | POA: Diagnosis present

## 2022-10-06 DIAGNOSIS — O9952 Diseases of the respiratory system complicating childbirth: Secondary | ICD-10-CM | POA: Diagnosis present

## 2022-10-06 DIAGNOSIS — J45909 Unspecified asthma, uncomplicated: Secondary | ICD-10-CM | POA: Diagnosis present

## 2022-10-06 DIAGNOSIS — O479 False labor, unspecified: Secondary | ICD-10-CM | POA: Diagnosis present

## 2022-10-06 LAB — CBC
HCT: 38.2 % (ref 36.0–46.0)
Hemoglobin: 11.3 g/dL — ABNORMAL LOW (ref 12.0–15.0)
MCH: 23.2 pg — ABNORMAL LOW (ref 26.0–34.0)
MCHC: 29.6 g/dL — ABNORMAL LOW (ref 30.0–36.0)
MCV: 78.4 fL — ABNORMAL LOW (ref 80.0–100.0)
Platelets: 459 10*3/uL — ABNORMAL HIGH (ref 150–400)
RBC: 4.87 MIL/uL (ref 3.87–5.11)
RDW: 15.3 % (ref 11.5–15.5)
WBC: 11.6 10*3/uL — ABNORMAL HIGH (ref 4.0–10.5)
nRBC: 0 % (ref 0.0–0.2)

## 2022-10-06 LAB — URINALYSIS, COMPLETE (UACMP) WITH MICROSCOPIC
Bilirubin Urine: NEGATIVE
Glucose, UA: NEGATIVE mg/dL
Ketones, ur: NEGATIVE mg/dL
Nitrite: NEGATIVE
Protein, ur: NEGATIVE mg/dL
RBC / HPF: >50 RBC/hpf — ABNORMAL HIGH (ref 0–5)
Specific Gravity, Urine: 1.013 (ref 1.005–1.030)
pH: 6 (ref 5.0–8.0)

## 2022-10-06 LAB — URINE DRUG SCREEN, QUALITATIVE (ARMC ONLY)
Amphetamines, Ur Screen: NOT DETECTED
Barbiturates, Ur Screen: NOT DETECTED
Benzodiazepine, Ur Scrn: NOT DETECTED
Cannabinoid 50 Ng, Ur ~~LOC~~: NOT DETECTED
Cocaine Metabolite,Ur ~~LOC~~: NOT DETECTED
MDMA (Ecstasy)Ur Screen: NOT DETECTED
Methadone Scn, Ur: NOT DETECTED
Opiate, Ur Screen: NOT DETECTED
Phencyclidine (PCP) Ur S: NOT DETECTED
Tricyclic, Ur Screen: NOT DETECTED

## 2022-10-06 LAB — WET PREP, GENITAL
Clue Cells Wet Prep HPF POC: NONE SEEN
Sperm: NONE SEEN
Trich, Wet Prep: NONE SEEN
WBC, Wet Prep HPF POC: <10 (ref <10)
Yeast Wet Prep HPF POC: NONE SEEN

## 2022-10-06 LAB — TYPE AND SCREEN
ABO/RH(D): A POS
Antibody Screen: NEGATIVE

## 2022-10-06 LAB — GROUP B STREP BY PCR: Group B strep by PCR: NEGATIVE

## 2022-10-06 LAB — CHLAMYDIA/NGC RT PCR (ARMC ONLY)
Chlamydia Tr: NOT DETECTED
N gonorrhoeae: NOT DETECTED

## 2022-10-06 LAB — HIV ANTIBODY (ROUTINE TESTING W REFLEX): HIV Screen 4th Generation wRfx: NONREACTIVE

## 2022-10-06 MED ORDER — SIMETHICONE 80 MG PO CHEW: 80.0000 mg | CHEWABLE_TABLET | ORAL | Status: DC | PRN

## 2022-10-06 MED ORDER — BUPIVACAINE HCL (PF) 0.25 % IJ SOLN
INTRAMUSCULAR | Status: DC | PRN
  Administered 2022-10-06 (×2): 3 mL via EPIDURAL
  Administered 2022-10-06: 4 mL via EPIDURAL

## 2022-10-06 MED ORDER — MEPERIDINE HCL 25 MG/ML IJ SOLN: 6.2500 mg | INTRAMUSCULAR | Status: DC | PRN

## 2022-10-06 MED ORDER — SCOPOLAMINE 1 MG/3DAYS TD PT72: 1.0000 | MEDICATED_PATCH | Freq: Once | TRANSDERMAL | Status: DC

## 2022-10-06 MED ORDER — LACTATED RINGERS IV SOLN
500.0000 mL | INTRAVENOUS | Status: DC | PRN
  Administered 2022-10-06: 1000 mL via INTRAVENOUS

## 2022-10-06 MED ORDER — DOCUSATE SODIUM 100 MG PO CAPS
100.0000 mg | ORAL_CAPSULE | Freq: Two times a day (BID) | ORAL | Status: DC
  Administered 2022-10-07 – 2022-10-08 (×2): 100 mg via ORAL
  Filled 2022-10-06 (×3): qty 1

## 2022-10-06 MED ORDER — EPHEDRINE 5 MG/ML INJ: 10.0000 mg | INTRAVENOUS | Status: DC | PRN

## 2022-10-06 MED ORDER — SODIUM CHLORIDE 0.9% FLUSH: 3.0000 mL | INTRAVENOUS | Status: DC | PRN

## 2022-10-06 MED ORDER — ZOLPIDEM TARTRATE 5 MG PO TABS: 5.0000 mg | ORAL_TABLET | Freq: Every evening | ORAL | Status: DC | PRN

## 2022-10-06 MED ORDER — FENTANYL-BUPIVACAINE-NACL 0.5-0.125-0.9 MG/250ML-% EP SOLN
12.0000 mL/h | EPIDURAL | Status: DC | PRN
  Administered 2022-10-06: 12 mL/h via EPIDURAL

## 2022-10-06 MED ORDER — SODIUM CHLORIDE 0.9 % IV SOLN
5.0000 10*6.[IU] | Freq: Once | INTRAVENOUS | Status: AC
  Administered 2022-10-06: 5 10*6.[IU] via INTRAVENOUS
  Filled 2022-10-06: qty 5

## 2022-10-06 MED ORDER — TERBUTALINE SULFATE 1 MG/ML IJ SOLN: 0.2500 mg | Freq: Once | INTRAMUSCULAR | Status: DC | PRN

## 2022-10-06 MED ORDER — LACTATED RINGERS IV SOLN: INTRAVENOUS | Status: DC

## 2022-10-06 MED ORDER — LIDOCAINE-EPINEPHRINE (PF) 1.5 %-1:200000 IJ SOLN
INTRAMUSCULAR | Status: DC | PRN
  Administered 2022-10-06: 3 mL via PERINEURAL

## 2022-10-06 MED ORDER — DIPHENHYDRAMINE HCL 25 MG PO CAPS: 25.0000 mg | ORAL_CAPSULE | Freq: Four times a day (QID) | ORAL | Status: DC | PRN

## 2022-10-06 MED ORDER — ONDANSETRON HCL 4 MG PO TABS: 4.0000 mg | ORAL_TABLET | ORAL | Status: DC | PRN

## 2022-10-06 MED ORDER — LIDOCAINE HCL (PF) 1 % IJ SOLN
INTRAMUSCULAR | Status: DC | PRN
  Administered 2022-10-06: 3 mL

## 2022-10-06 MED ORDER — OXYTOCIN-SODIUM CHLORIDE 30-0.9 UT/500ML-% IV SOLN
2.5000 [IU]/h | INTRAVENOUS | Status: DC
  Administered 2022-10-06: 2.5 [IU]/h via INTRAVENOUS
  Filled 2022-10-06: qty 500

## 2022-10-06 MED ORDER — NALOXONE HCL 4 MG/10ML IJ SOLN
1.0000 ug/kg/h | INTRAVENOUS | Status: DC | PRN
  Filled 2022-10-06: qty 5

## 2022-10-06 MED ORDER — DIBUCAINE (PERIANAL) 1 % EX OINT
1.0000 | TOPICAL_OINTMENT | CUTANEOUS | Status: DC | PRN
  Administered 2022-10-06: 1 via RECTAL
  Filled 2022-10-06 (×2): qty 28

## 2022-10-06 MED ORDER — FENTANYL CITRATE (PF) 100 MCG/2ML IJ SOLN
INTRAMUSCULAR | Status: DC | PRN
  Administered 2022-10-06: 100 ug via EPIDURAL

## 2022-10-06 MED ORDER — CEFAZOLIN SODIUM-DEXTROSE 2-4 GM/100ML-% IV SOLN
2.0000 g | Freq: Once | INTRAVENOUS | Status: AC
  Administered 2022-10-06: 2 g via INTRAVENOUS
  Filled 2022-10-06 (×2): qty 100

## 2022-10-06 MED ORDER — NALOXONE HCL 0.4 MG/ML IJ SOLN: 0.4000 mg | INTRAMUSCULAR | Status: DC | PRN

## 2022-10-06 MED ORDER — LIDOCAINE HCL (PF) 1 % IJ SOLN
30.0000 mL | INTRAMUSCULAR | Status: AC | PRN
  Administered 2022-10-06: 30 mL via SUBCUTANEOUS
  Filled 2022-10-06: qty 30

## 2022-10-06 MED ORDER — PENICILLIN G POT IN DEXTROSE 60000 UNIT/ML IV SOLN: 3.0000 10*6.[IU] | INTRAVENOUS | Status: DC

## 2022-10-06 MED ORDER — PHENYLEPHRINE 80 MCG/ML (10ML) SYRINGE FOR IV PUSH (FOR BLOOD PRESSURE SUPPORT): 80.0000 ug | PREFILLED_SYRINGE | INTRAVENOUS | Status: DC | PRN

## 2022-10-06 MED ORDER — DIPHENHYDRAMINE HCL 50 MG/ML IJ SOLN: 12.5000 mg | INTRAMUSCULAR | Status: DC | PRN

## 2022-10-06 MED ORDER — OXYTOCIN BOLUS FROM INFUSION
333.0000 mL | Freq: Once | INTRAVENOUS | Status: AC
  Administered 2022-10-06: 333 mL via INTRAVENOUS

## 2022-10-06 MED ORDER — PENICILLIN G POTASSIUM 5000000 UNITS IJ SOLR: 5.0000 10*6.[IU] | Freq: Once | INTRAMUSCULAR | Status: DC

## 2022-10-06 MED ORDER — PRENATAL MULTIVITAMIN CH
1.0000 | ORAL_TABLET | Freq: Every day | ORAL | Status: DC
  Administered 2022-10-07: 1 via ORAL
  Filled 2022-10-06: qty 1

## 2022-10-06 MED ORDER — PENICILLIN G POT IN DEXTROSE 60000 UNIT/ML IV SOLN
3.0000 10*6.[IU] | INTRAVENOUS | Status: DC
  Filled 2022-10-06 (×7): qty 50

## 2022-10-06 MED ORDER — WITCH HAZEL-GLYCERIN EX PADS
1.0000 | MEDICATED_PAD | CUTANEOUS | Status: DC | PRN
  Administered 2022-10-06: 1 via TOPICAL
  Filled 2022-10-06 (×5): qty 100

## 2022-10-06 MED ORDER — LACTATED RINGERS IV SOLN
500.0000 mL | Freq: Once | INTRAVENOUS | Status: AC
  Administered 2022-10-06: 500 mL via INTRAVENOUS

## 2022-10-06 MED ORDER — DIPHENHYDRAMINE HCL 25 MG PO CAPS: 25.0000 mg | ORAL_CAPSULE | ORAL | Status: DC | PRN

## 2022-10-06 MED ORDER — OXYTOCIN-SODIUM CHLORIDE 30-0.9 UT/500ML-% IV SOLN
1.0000 m[IU]/min | INTRAVENOUS | Status: DC
  Administered 2022-10-06: 2 m[IU]/min via INTRAVENOUS

## 2022-10-06 MED ORDER — BENZOCAINE-MENTHOL 20-0.5 % EX AERO
1.0000 | INHALATION_SPRAY | CUTANEOUS | Status: DC | PRN
  Administered 2022-10-06: 1 via TOPICAL
  Filled 2022-10-06 (×3): qty 56

## 2022-10-06 MED ORDER — FENTANYL-BUPIVACAINE-NACL 0.5-0.125-0.9 MG/250ML-% EP SOLN
EPIDURAL | Status: AC
  Filled 2022-10-06: qty 250

## 2022-10-06 MED ORDER — ACETAMINOPHEN 500 MG PO TABS
1000.0000 mg | ORAL_TABLET | Freq: Four times a day (QID) | ORAL | Status: DC
  Administered 2022-10-06 – 2022-10-08 (×5): 1000 mg via ORAL
  Filled 2022-10-06 (×6): qty 2

## 2022-10-06 MED ORDER — ONDANSETRON HCL 4 MG/2ML IJ SOLN: 4.0000 mg | INTRAMUSCULAR | Status: DC | PRN

## 2022-10-06 MED ORDER — COCONUT OIL OIL: 1.0000 | TOPICAL_OIL | Status: DC | PRN

## 2022-10-06 MED ORDER — IBUPROFEN 600 MG PO TABS
600.0000 mg | ORAL_TABLET | Freq: Four times a day (QID) | ORAL | Status: DC
  Administered 2022-10-06 – 2022-10-08 (×5): 600 mg via ORAL
  Filled 2022-10-06 (×6): qty 1

## 2022-10-06 MED ORDER — ONDANSETRON HCL 4 MG/2ML IJ SOLN: 4.0000 mg | Freq: Three times a day (TID) | INTRAMUSCULAR | Status: DC | PRN

## 2022-10-06 MED ORDER — ONDANSETRON HCL 4 MG/2ML IJ SOLN: 4.0000 mg | Freq: Four times a day (QID) | INTRAMUSCULAR | Status: DC | PRN

## 2022-10-06 MED ORDER — FENTANYL CITRATE (PF) 100 MCG/2ML IJ SOLN
INTRAMUSCULAR | Status: AC
  Filled 2022-10-06: qty 2

## 2022-10-06 MED ORDER — ACETAMINOPHEN 325 MG PO TABS: 650.0000 mg | ORAL_TABLET | ORAL | Status: DC | PRN

## 2022-10-06 NOTE — Anesthesia Procedure Notes (Signed)
Epidural Patient location during procedure: OB Start time: 10/06/2022 8:36 AM End time: 10/06/2022 8:39 AM  Staffing Anesthesiologist: Louie Boston, MD Resident/CRNA: Hezzie Bump, CRNA Performed: resident/CRNA   Preanesthetic Checklist Completed: patient identified, IV checked, site marked, risks and benefits discussed, surgical consent, monitors and equipment checked, pre-op evaluation and timeout performed  Epidural Patient position: sitting Prep: ChloraPrep Patient monitoring: heart rate, continuous pulse ox and blood pressure Approach: midline Location: L3-L4 Injection technique: LOR saline  Needle:  Needle type: Tuohy  Needle gauge: 17 G Needle length: 9 cm and 9 Needle insertion depth: 8 cm Catheter type: closed end flexible Catheter size: 19 Gauge Catheter at skin depth: 13 cm Test dose: negative and 1.5% lidocaine with Epi 1:200 K  Assessment Sensory level: T10 Events: blood not aspirated, no cerebrospinal fluid, injection not painful, no injection resistance, no paresthesia and negative IV test  Additional Notes 1 attempt Pt. Evaluated and documentation done after procedure finished. Patient identified. Risks/Benefits/Options discussed with patient including but not limited to bleeding, infection, nerve damage, paralysis, failed block, incomplete pain control, headache, blood pressure changes, nausea, vomiting, reactions to medication both or allergic, itching and postpartum back pain. Confirmed with bedside nurse the patient's most recent platelet count. Confirmed with patient that they are not currently taking any anticoagulation, have any bleeding history or any family history of bleeding disorders. Patient expressed understanding and wished to proceed. All questions were answered. Sterile technique was used throughout the entire procedure. Please see nursing notes for vital signs. Test dose was given through epidural catheter and negative prior to continuing  to dose epidural or start infusion. Warning signs of high block given to the patient including shortness of breath, tingling/numbness in hands, complete motor block, or any concerning symptoms with instructions to call for help. Patient was given instructions on fall risk and not to get out of bed. All questions and concerns addressed with instructions to call with any issues or inadequate analgesia.    Patient tolerated the insertion well without immediate complications.Reason for block:procedure for pain

## 2022-10-06 NOTE — Progress Notes (Signed)
Subjective:  Pt now comfortable after epidural bolus.  Pt's partner asking about c-section and if a pt is able to request one.   Objective:   Vitals: Blood pressure (!) 118/54, pulse 76, temperature 98.9 F (37.2 C), temperature source Oral, resp. rate 18, height 5\' 3"  (1.6 m), weight 95.3 kg, last menstrual period 12/23/2021, SpO2 100 %. General: tired appearing Abdomen:non tender  Cervical Exam:  Dilation: 9 Effacement (%): 100 Cervical Position: Middle Station: 0 Presentation: Vertex Exam by:: Agnes Brightbill CNM  FES: baseline 140, moderate variability, pos accel,  variable or early present t Toco:q 1-3  Results for orders placed or performed during the hospital encounter of 10/06/22 (from the past 24 hour(s))  Urinalysis, Complete w Microscopic     Status: Abnormal   Collection Time: 10/06/22  6:51 AM  Result Value Ref Range   Color, Urine YELLOW (A) YELLOW   APPearance HAZY (A) CLEAR   Specific Gravity, Urine 1.013 1.005 - 1.030   pH 6.0 5.0 - 8.0   Glucose, UA NEGATIVE NEGATIVE mg/dL   Hgb urine dipstick LARGE (A) NEGATIVE   Bilirubin Urine NEGATIVE NEGATIVE   Ketones, ur NEGATIVE NEGATIVE mg/dL   Protein, ur NEGATIVE NEGATIVE mg/dL   Nitrite NEGATIVE NEGATIVE   Leukocytes,Ua TRACE (A) NEGATIVE   RBC / HPF >50 (H) 0 - 5 RBC/hpf   WBC, UA 11-20 0 - 5 WBC/hpf   Bacteria, UA RARE (A) NONE SEEN   Squamous Epithelial / LPF 6-10 0 - 5   Mucus PRESENT   Group B strep by PCR     Status: None   Collection Time: 10/06/22  6:51 AM   Specimen: Vaginal/Rectal; Genital  Result Value Ref Range   Group B strep by PCR NEGATIVE NEGATIVE  Urine Drug Screen, Qualitative ([facility] only)     Status: None   Collection Time: 10/06/22  6:51 AM  Result Value Ref Range   Tricyclic, Ur Screen NONE DETECTED NONE DETECTED   Amphetamines, Ur Screen NONE DETECTED NONE DETECTED   MDMA (Ecstasy)Ur Screen NONE DETECTED NONE DETECTED   Cocaine Metabolite,Ur [place] NONE DETECTED NONE DETECTED   Opiate, Ur  Screen NONE DETECTED NONE DETECTED   Phencyclidine (PCP) Ur S NONE DETECTED NONE DETECTED   Cannabinoid 50 Ng, Ur [place] NONE DETECTED NONE DETECTED   Barbiturates, Ur Screen NONE DETECTED NONE DETECTED   Benzodiazepine, Ur Scrn NONE DETECTED NONE DETECTED   Methadone Scn, Ur NONE DETECTED NONE DETECTED  Wet prep, genital     Status: None   Collection Time: 10/06/22  6:51 AM  Result Value Ref Range   Yeast Wet Prep HPF POC NONE SEEN NONE SEEN   Trich, Wet Prep NONE SEEN NONE SEEN   Clue Cells Wet Prep HPF POC NONE SEEN NONE SEEN   WBC, Wet Prep HPF POC <10 <10   Sperm NONE SEEN   Chlamydia/NGC rt PCR ([facility] only)     Status: None   Collection Time: 10/06/22  6:51 AM   Specimen: Cervical/Vaginal swab  Result Value Ref Range   Specimen source GC/Chlam ENDOCERVICAL    Chlamydia Tr NOT DETECTED NOT DETECTED   N gonorrhoeae NOT DETECTED NOT DETECTED  CBC     Status: Abnormal   Collection Time: 10/06/22  6:51 AM  Result Value Ref Range   WBC 11.6 (H) 4.0 - 10.5 K/uL   RBC 4.87 3.87 - 5.11 MIL/uL   Hemoglobin 11.3 (L) 12.0 - 15.0 g/dL   HCT 14/07/23 60.1 - 09.3 %  MCV 78.4 (L) 80.0 - 100.0 fL   MCH 23.2 (L) 26.0 - 34.0 pg   MCHC 29.6 (L) 30.0 - 36.0 g/dL   RDW 28.3 15.1 - 76.1 %   Platelets 459 (H) 150 - 400 K/uL   nRBC 0.0 0.0 - 0.2 %  Type and screen [facility]     Status: None   Collection Time: 10/06/22  6:51 AM  Result Value Ref Range   ABO/RH(D) A POS    Antibody Screen NEG    Sample Expiration      10/09/2022,2359 Performed at [facility], [address] [place], [place] [address]   HIV Antibody (routine testing w rflx)     Status: None   Collection Time: 10/06/22  6:51 AM  Result Value Ref Range   HIV Screen 4th Generation wRfx Non Reactive Non Reactive    Assessment:   26 y.o. G3P0020 [redacted]w[redacted]d admitted in labor   Plan:   1) Labor -active labor, called to room for decel, FSE applied, fetal heart rate returned to  baseline. At the time pt  reported severe pain and feeling "weak", states she wanted a c-section, reviewed c-section is not advised at this time, first we must get her pain better controlled and try to move forward with a vaginal birth. Pitocin was started and then stopped for the decel, will start pitocin when able.   2) Fetus - category II tracing   3) GBS negative, AROM for clear at 1008  4) Pain management: bolus given by Anesthesia   Carie Caddy, CNM  [facility]  10/06/2022 1:30 PM

## 2022-10-06 NOTE — Discharge Summary (Signed)
Obstetrical Discharge Summary  Date of Admission: 10/06/2022 Date of Discharge: 10/08/2022  Primary OB: [facility]   Gestational Age at Delivery: [redacted]w[redacted]d   Antepartum complications: Limited prenatal care, BMI 37, percocet use  Reason for Admission: Labor Date of Delivery: 10/06/2022  Delivered By: Siri Cole, CNM  Delivery Type: spontaneous vaginal delivery Intrapartum complications/course: Retained placenta  Anesthesia: local and epidural Placenta: Delivered and expressed via active management. Intact: yes.  To pathology: no.  Laceration: 2nd degree Episiotomy: none EBL: Baby: Liveborn female, APGARs 9/9, weight 8lbs    Discharge Diagnosis: Delivered.   Postpartum course: Routine postpartum course; she is tolerating regular diet, her pain is controlled with PO medication, she is ambulating and voiding without difficulty, she reports some perineal and back discomfort.  Discharge Vital Signs:  Current Vital Signs 24h Vital Sign Ranges  T 98.2 F (36.8 C) Temp  Avg: 98.2 F (36.8 C)  Min: 98 F (36.7 C)  Max: 98.2 F (36.8 C)  BP 114/67 BP  Min: 112/62  Max: 128/78  HR 76 Pulse  Avg: 70.5  Min: 66  Max: 76  RR 16 Resp  Avg: 17.3  Min: 16  Max: 18  SaO2 99 %  (Room Air) SpO2  Avg: 99.3 %  Min: 99 %  Max: 100 %       24 Hour I/O Current Shift I/O  Time Ins Outs No intake/output data recorded. No intake/output data recorded.     Patient Vitals for the past 6 hrs:  BP Temp Temp src Pulse Resp SpO2  10/08/22 0742 114/67 98.2 F (36.8 C) Oral 76 16 99 %    Discharge Exam:  NAD Perineum: 2nd degree repaired Abdomen: firm fundus below the umbilicus, NTTP, non distended, +bowel sounds.  Incision: NA RRR no MRGs CTAB Ext: no c/c/e  Recent Labs  Lab 10/06/22 0651 10/07/22 0528  WBC 11.6* 15.3*  HGB 11.3* 8.6*  HCT 38.2 28.4*  PLT 459* 367    Disposition: Home  Rh Immune globulin given: no Rubella vaccine given: no Tdap vaccine given in AP or PP setting:  no Flu vaccine given in AP or PP setting: no  Contraception: IUD  Prenatal/Postnatal Panel: A POS//Rubella Immune//Varicella Immune//RPR negative//HIV negative/HepB Surface Ag negative//pap no abnormalities (date: 2019)//plans to bottle feed  Plan:  Shannon Alvarado was discharged to home in good condition. Follow-up appointment with LMD in 2 and 6 weeks for a PP visit. Needs PAP  No future appointments.  Discharge Medications: Allergies as of 10/08/2022   No Known Allergies      Medication List     STOP taking these medications    ferrous sulfate 325 (65 FE) MG tablet       TAKE these medications    albuterol 108 (90 Base) MCG/ACT inhaler Commonly known as: VENTOLIN HFA Inhale 2 puffs into the lungs every 6 (six) hours as needed for wheezing or shortness of breath.   prenatal multivitamin Tabs tablet Take 1 tablet by mouth daily at 12 noon.        Shannon Alvarado, CNM [facility]  10/06/2022 9:18 AM

## 2022-10-06 NOTE — H&P (Signed)
OB History & Physical   History of Present Illness:  Chief Complaint: contractions  HPI:  Shannon Alvarado is a 26 y.o. G78P0020 female at [redacted]w[redacted]d dated by ultrasound.  Her pregnancy has been complicated by late and limited prenatal care, obesity, fetal hydronephrosis/resolved, she has a history of 2 miscarriages, percocet use- self admitted.    She reports contractions.   She reports possible leakage of fluid 2 days ago with leaking since then.   She reports vaginal spotting.   She reports fetal movement.    Total weight gain for pregnancy: 11.8 kg    Maternal Medical History:   Past Medical History:  Diagnosis Date   Asthma    Irregular periods    Obesity     History reviewed. No pertinent surgical history.  No Known Allergies  Prior to Admission medications   Medication Sig Start Date End Date Taking? Authorizing Provider  albuterol (VENTOLIN HFA) 108 (90 Base) MCG/ACT inhaler Inhale 2 puffs into the lungs every 6 (six) hours as needed for wheezing or shortness of breath. 11/01/20  Yes Lucy Chris, PA  Prenatal Vit-Fe Fumarate-FA (PRENATAL MULTIVITAMIN) TABS tablet Take 1 tablet by mouth daily at 12 noon.   Yes [provider]  ferrous sulfate 325 (65 FE) MG tablet Take 325 mg by mouth daily with breakfast. Patient not taking: Reported on 10/06/2022    [provider]    OB History  Gravida Para Term Preterm AB Living  3       2    SAB IAB Ectopic Multiple Live Births  2            # Outcome Date GA Lbr Len/2nd Weight Sex Delivery Anes PTL Lv  3 Current           2 SAB 10/2021          1 SAB 12/30/20 [redacted]w[redacted]d           Prenatal care site: [facility]  Social History: She  reports that she has never smoked. She has never used smokeless tobacco. She reports that she does not drink alcohol and does not use drugs.  Family History: family history includes Breast cancer in her maternal aunt; Cancer in her maternal aunt and mother; Hyperlipidemia  in her father; Hypertension in her father; Ovarian cancer in her mother.    Review of Systems:  Review of Systems  Constitutional:  Negative for chills and fever.  HENT:  Negative for congestion, ear discharge, ear pain, hearing loss, sinus pain and sore throat.   Eyes:  Negative for blurred vision and double vision.  Respiratory:  Negative for cough, shortness of breath and wheezing.   Cardiovascular:  Negative for chest pain, palpitations and leg swelling.  Gastrointestinal:  Positive for abdominal pain. Negative for blood in stool, constipation, diarrhea, heartburn, melena, nausea and vomiting.  Genitourinary:  Negative for dysuria, flank pain, frequency, hematuria and urgency.  Musculoskeletal:  Positive for back pain. Negative for joint pain and myalgias.  Skin:  Negative for itching and rash.  Neurological:  Negative for dizziness, tingling, tremors, sensory change, speech change, focal weakness, seizures, loss of consciousness, weakness and headaches.  Endo/Heme/Allergies:  Negative for environmental allergies. Does not bruise/bleed easily.  Psychiatric/Behavioral:  Negative for depression, hallucinations, memory loss, substance abuse and suicidal ideas. The patient is not nervous/anxious and does not have insomnia.      Physical Exam:  BP 136/75 (BP Location: Left Arm)   Pulse 72  Temp 98.7 F (37.1 C) (Oral)   Resp 18   Ht 5\' 3"  (1.6 m)   Wt 95.3 kg   LMP 12/23/2021 Comment: has not had a cycle since miscarriage in April  BMI 37.20 kg/m   Constitutional: Well nourished, well developed female in mild acute distress.  HEENT: normal Skin: Warm and dry.  Cardiovascular: Regular rate and rhythm.   Extremity:  trace edema   Respiratory: Clear to auscultation bilateral. Normal respiratory effort Abdomen: FHT present Back: no CVAT Neuro: DTRs 2+, Cranial nerves grossly intact Psych: Alert and Oriented x3. No memory deficits. Limited coping ability.  MS: normal gait, normal  bilateral lower extremity ROM/strength/stability.  Pelvic exam: per RN 2/60/-2   Pertinent Results:   Blood type/Rh A positive  Antibody screen negative  Rubella Immune  Varicella Immune    RPR Non reactive  HBsAg negative  HIV negative  GC negative  Chlamydia negative  Genetic screening Negative/female  1 hour GTT 120 (early result)  3 hour GTT Not done  GBS Result pending   Baseline FHR: 135 beats/min   Variability: moderate   Accelerations: present   Decelerations: absent Contractions: present frequency: every 2-3 Overall assessment: reassuring   Lab Results  Component Value Date   SARSCOV2NAA NEGATIVE 01/11/2022    Assessment:  Shannon Alvarado is a 26 y.o. G15P0020 female at [redacted]w[redacted]d with active labor.   Plan:  Admit to Labor & Delivery  CBC, T&S, RPR, HIV, Clrs, IVF, wet prep, GC/CT, ROM plus, GBS by PCR GBS pending. Questionable SROM 2 days ago: tx prophylactically w penicillin   Fetal well-being: Category I Anticipate vaginal delivery    [redacted]w[redacted]d, CNM 10/06/2022 7:31 AM

## 2022-10-06 NOTE — Anesthesia Preprocedure Evaluation (Signed)
Anesthesia Evaluation  Patient identified by MRN, date of birth, ID band Patient awake    Reviewed: Allergy & Precautions, H&P , NPO status , Patient's Chart, lab work & pertinent test results  Airway Mallampati: II   Neck ROM: full    Dental no notable dental hx.    Pulmonary asthma    Pulmonary exam normal        Cardiovascular negative cardio ROS Normal cardiovascular exam     Neuro/Psych negative neurological ROS  negative psych ROS   GI/Hepatic Neg liver ROS,GERD  Controlled,,  Endo/Other  negative endocrine ROS    Renal/GU negative Renal ROS  negative genitourinary   Musculoskeletal   Abdominal   Peds  Hematology negative hematology ROS (+)   Anesthesia Other Findings   Reproductive/Obstetrics (+) Pregnancy                             Anesthesia Physical Anesthesia Plan  ASA: 2  Anesthesia Plan: Epidural   Post-op Pain Management:    Induction:   PONV Risk Score and Plan:   Airway Management Planned:   Additional Equipment:   Intra-op Plan:   Post-operative Plan:   Informed Consent: I have reviewed the patients History and Physical, chart, labs and discussed the procedure including the risks, benefits and alternatives for the proposed anesthesia with the patient or authorized representative who has indicated his/her understanding and acceptance.       Plan Discussed with: Anesthesiologist and CRNA  Anesthesia Plan Comments:        Anesthesia Quick Evaluation

## 2022-10-06 NOTE — Progress Notes (Signed)
Subjective:  Reporting increased vaginal pressure and lower abdominal pressure with contractions. Is using PCEA button. Changing positions frequently. Currently Right side.   Objective:   Vitals: Blood pressure (!) 144/67, pulse 75, temperature 98.7 F (37.1 C), temperature source Oral, resp. rate 18, height 5\' 3"  (1.6 m), weight 95.3 kg, last menstrual period 12/23/2021, SpO2 100 %. General: tired appearing  Abdomen:non tender  Cervical Exam:  Dilation: 9 Effacement (%): 100 Cervical Position: Middle Station: 0 Presentation: Vertex Exam by:: Ricki Vanhandel CNM  FHT: baseline 140, moderate variability, pos accel, variables present  Toco:q 2-3   Results for orders placed or performed during the hospital encounter of 10/06/22 (from the past 24 hour(s))  Urinalysis, Complete w Microscopic     Status: Abnormal   Collection Time: 10/06/22  6:51 AM  Result Value Ref Range   Color, Urine YELLOW (A) YELLOW   APPearance HAZY (A) CLEAR   Specific Gravity, Urine 1.013 1.005 - 1.030   pH 6.0 5.0 - 8.0   Glucose, UA NEGATIVE NEGATIVE mg/dL   Hgb urine dipstick LARGE (A) NEGATIVE   Bilirubin Urine NEGATIVE NEGATIVE   Ketones, ur NEGATIVE NEGATIVE mg/dL   Protein, ur NEGATIVE NEGATIVE mg/dL   Nitrite NEGATIVE NEGATIVE   Leukocytes,Ua TRACE (A) NEGATIVE   RBC / HPF >50 (H) 0 - 5 RBC/hpf   WBC, UA 11-20 0 - 5 WBC/hpf   Bacteria, UA RARE (A) NONE SEEN   Squamous Epithelial / LPF 6-10 0 - 5   Mucus PRESENT   Group B strep by PCR     Status: None   Collection Time: 10/06/22  6:51 AM   Specimen: Vaginal/Rectal; Genital  Result Value Ref Range   Group B strep by PCR NEGATIVE NEGATIVE  Urine Drug Screen, Qualitative ([facility] only)     Status: None   Collection Time: 10/06/22  6:51 AM  Result Value Ref Range   Tricyclic, Ur Screen NONE DETECTED NONE DETECTED   Amphetamines, Ur Screen NONE DETECTED NONE DETECTED   MDMA (Ecstasy)Ur Screen NONE DETECTED NONE DETECTED   Cocaine Metabolite,Ur [place] NONE  DETECTED NONE DETECTED   Opiate, Ur Screen NONE DETECTED NONE DETECTED   Phencyclidine (PCP) Ur S NONE DETECTED NONE DETECTED   Cannabinoid 50 Ng, Ur [place] NONE DETECTED NONE DETECTED   Barbiturates, Ur Screen NONE DETECTED NONE DETECTED   Benzodiazepine, Ur Scrn NONE DETECTED NONE DETECTED   Methadone Scn, Ur NONE DETECTED NONE DETECTED  Wet prep, genital     Status: None   Collection Time: 10/06/22  6:51 AM  Result Value Ref Range   Yeast Wet Prep HPF POC NONE SEEN NONE SEEN   Trich, Wet Prep NONE SEEN NONE SEEN   Clue Cells Wet Prep HPF POC NONE SEEN NONE SEEN   WBC, Wet Prep HPF POC <10 <10   Sperm NONE SEEN   Chlamydia/NGC rt PCR ([facility] only)     Status: None   Collection Time: 10/06/22  6:51 AM   Specimen: Cervical/Vaginal swab  Result Value Ref Range   Specimen source GC/Chlam ENDOCERVICAL    Chlamydia Tr NOT DETECTED NOT DETECTED   N gonorrhoeae NOT DETECTED NOT DETECTED  CBC     Status: Abnormal   Collection Time: 10/06/22  6:51 AM  Result Value Ref Range   WBC 11.6 (H) 4.0 - 10.5 K/uL   RBC 4.87 3.87 - 5.11 MIL/uL   Hemoglobin 11.3 (L) 12.0 - 15.0 g/dL   HCT 14/07/23 42.5 - 95.6 %  MCV 78.4 (L) 80.0 - 100.0 fL   MCH 23.2 (L) 26.0 - 34.0 pg   MCHC 29.6 (L) 30.0 - 36.0 g/dL   RDW 60.6 30.1 - 60.1 %   Platelets 459 (H) 150 - 400 K/uL   nRBC 0.0 0.0 - 0.2 %  Type and screen [facility]     Status: None   Collection Time: 10/06/22  6:51 AM  Result Value Ref Range   ABO/RH(D) A POS    Antibody Screen NEG    Sample Expiration      10/09/2022,2359 Performed at [facility], [address] [place], [place] [address]     Assessment:   26 y.o. [phone] [redacted]w[redacted]d admitted in labor   Plan:   1) Labor -active labor, 9cm x 1.5 hours. Augmentation with Pitocin  offered, pt prefers to wait.   2) Fetus - category II tracing  3) GBS negative, AROM at for clear at 1008  4) Pain management: Epidural managed by Anesthesia   Carie Caddy, CNM  [facility]  10/07/2022  11:30 AM

## 2022-10-07 LAB — CBC
HCT: 28.4 % — ABNORMAL LOW (ref 36.0–46.0)
Hemoglobin: 8.6 g/dL — ABNORMAL LOW (ref 12.0–15.0)
MCH: 23.5 pg — ABNORMAL LOW (ref 26.0–34.0)
MCHC: 30.3 g/dL (ref 30.0–36.0)
MCV: 77.6 fL — ABNORMAL LOW (ref 80.0–100.0)
Platelets: 367 10*3/uL (ref 150–400)
RBC: 3.66 MIL/uL — ABNORMAL LOW (ref 3.87–5.11)
RDW: 15.4 % (ref 11.5–15.5)
WBC: 15.3 10*3/uL — ABNORMAL HIGH (ref 4.0–10.5)
nRBC: 0 % (ref 0.0–0.2)

## 2022-10-07 LAB — RPR: RPR Ser Ql: NONREACTIVE

## 2022-10-07 MED ORDER — FERROUS SULFATE 325 (65 FE) MG PO TABS
325.0000 mg | ORAL_TABLET | Freq: Two times a day (BID) | ORAL | Status: DC
  Administered 2022-10-07 – 2022-10-08 (×3): 325 mg via ORAL
  Filled 2022-10-07 (×3): qty 1

## 2022-10-07 NOTE — Discharge Instructions (Signed)

## 2022-10-07 NOTE — Progress Notes (Signed)
Post Partum Day 0 Subjective: Shannon Alvarado is feeling well overall. She is ambulating, voiding, and tolerating POs without difficulty. Her pain is well-controlled and her bleeding is WNL. She is having some burning pain in her stitches and some back pain from the epidural. She denies SOB, fatigue, and lightheadedness. Her mood is stable. Bottle feeding is going well. She reports that she had been taking Percocet during her pregnancy and is concerned about the effect on the baby.    Objective: Blood pressure 111/68, pulse 73, temperature 98.6 F (37 C), temperature source Oral, resp. rate 20, height 5\' 3"  (1.6 m), weight 95.3 kg, last menstrual period 12/23/2021, SpO2 100 %, unknown if currently breastfeeding.  Physical Exam:  General: alert, cooperative, and appears stated age Heart: RRR, murmur noted Lungs: CTAB Lochia: appropriate Uterine Fundus: firm Laceration: healing well, no significant drainage, no significant erythema DVT Evaluation: No evidence of DVT seen on physical exam.  Recent Labs    10/06/22 0651 10/07/22 0528  HGB 11.3* 8.6*  HCT 38.2 28.4*    Assessment/Plan: Plan for discharge tomorrow Social work consult today Reviewed comfort measures for laceration PO iron    LOS: 1 day   14/08/23, CNM 10/07/2022, 11:55 AM

## 2022-10-07 NOTE — Anesthesia Postprocedure Evaluation (Signed)
Anesthesia Post Note  Patient: Shannon Alvarado  Procedure(s) Performed: AN AD HOC LABOR EPIDURAL  Patient location during evaluation: Mother Baby Anesthesia Type: Epidural Level of consciousness: oriented and awake and alert Pain management: pain level controlled Vital Signs Assessment: post-procedure vital signs reviewed and stable Respiratory status: spontaneous breathing and respiratory function stable Cardiovascular status: blood pressure returned to baseline and stable Postop Assessment: no headache, no backache, no apparent nausea or vomiting and able to ambulate Anesthetic complications: no  No notable events documented.   Last Vitals:  Vitals:   10/06/22 2347 10/07/22 0354  BP: (!) 124/57 (!) 106/56  Pulse: 89 64  Resp: 18 18  Temp: 36.9 C 37.1 C  SpO2: 100% 98%    Last Pain:  Vitals:   10/07/22 0645  TempSrc:   PainSc: 5                  Starling Manns

## 2022-10-08 NOTE — Progress Notes (Signed)
Discharge instructions reviewed with patient.  Questions answered.  Printed copies of discharge education given to patient for reference after discharge home.  Patient and baby transported to ED entrance by wheelchair.  Patient's mother was waiting at the ED entrance for transportation home.

## 2022-10-08 NOTE — Progress Notes (Signed)
CSW spoke with patient in regards to her using percocet during her pregnancy. Patient told CSW that it was a one time thing and she isn't going to do that anymore. CSW explained the seriousness of using substances that are not prescribed. CSW asked patient if she needs any substance abuse resources. Patient stated no and that she is fine. Patient indicated that she has all necessary baby items. Patient stated she applied for [facility] and she was told to follow-up after she has her baby. CSW told patient to make sure she has all the necessary documents to submit to [facility] to make the process smoother. Patient told CSW that she should have everything.

## 2022-10-14 ENCOUNTER — Telehealth: Payer: Self-pay | Admitting: Licensed Practical Nurse

## 2022-10-14 NOTE — Telephone Encounter (Signed)
Reached out to pt to schedule 2 week PP telephone visit with LMD on 12/21 at 11:35.  Could not leave message for pt, mailbox was not set up.

## 2022-10-20 NOTE — Telephone Encounter (Signed)
I was unable to scheduled patient for a my chart via until 11/09/22. I have patient scheduled for 11/17/22 with LMD for 6 week PP follow up.

## 2022-11-16 ENCOUNTER — Telehealth: Payer: Self-pay

## 2022-11-16 NOTE — Telephone Encounter (Signed)
[facility]- Discharge Call Backs- Pt was called to check on her since her recent discharge from the hospital.  Phone went to her Voice Mail which is not set up yet.

## 2022-11-17 ENCOUNTER — Ambulatory Visit (INDEPENDENT_AMBULATORY_CARE_PROVIDER_SITE_OTHER): Payer: Medicaid Other | Admitting: Licensed Practical Nurse

## 2022-11-17 ENCOUNTER — Other Ambulatory Visit (HOSPITAL_COMMUNITY)
Admission: RE | Admit: 2022-11-17 | Discharge: 2022-11-17 | Disposition: A | Discharge status: Home / Self Care | Payer: Medicaid Other | Source: Ambulatory Visit | Attending: Licensed Practical Nurse | Admitting: Licensed Practical Nurse

## 2022-11-17 DIAGNOSIS — Z1332 Encounter for screening for maternal depression: Secondary | ICD-10-CM

## 2022-11-17 DIAGNOSIS — Z124 Encounter for screening for malignant neoplasm of cervix: Secondary | ICD-10-CM

## 2022-11-17 DIAGNOSIS — Z3202 Encounter for pregnancy test, result negative: Secondary | ICD-10-CM | POA: Diagnosis not present

## 2022-11-17 DIAGNOSIS — Z30430 Encounter for insertion of intrauterine contraceptive device: Secondary | ICD-10-CM | POA: Diagnosis not present

## 2022-11-17 DIAGNOSIS — M549 Dorsalgia, unspecified: Secondary | ICD-10-CM

## 2022-11-17 DIAGNOSIS — Z3040 Encounter for surveillance of contraceptives, unspecified: Secondary | ICD-10-CM

## 2022-11-17 LAB — POCT URINE PREGNANCY: Preg Test, Ur: NEGATIVE

## 2022-11-17 MED ORDER — LEVONORGESTREL 20 MCG/DAY IU IUD
1.0000 | INTRAUTERINE_SYSTEM | Freq: Once | INTRAUTERINE | Status: AC
  Administered 2022-11-17: 1 via INTRAUTERINE
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
## 2022-11-18 ENCOUNTER — Encounter: Payer: Self-pay | Admitting: Licensed Practical Nurse

## 2022-11-24 LAB — CYTOLOGY - PAP
Chlamydia: NEGATIVE
Comment: NEGATIVE
Comment: NORMAL
Diagnosis: NEGATIVE
Neisseria Gonorrhea: NEGATIVE

## 2024-01-28 ENCOUNTER — Other Ambulatory Visit: Payer: Self-pay

## 2024-01-28 DIAGNOSIS — J101 Influenza due to other identified influenza virus with other respiratory manifestations: Secondary | ICD-10-CM | POA: Insufficient documentation

## 2024-01-28 DIAGNOSIS — R059 Cough, unspecified: Secondary | ICD-10-CM | POA: Diagnosis present

## 2024-01-28 DIAGNOSIS — J45909 Unspecified asthma, uncomplicated: Secondary | ICD-10-CM | POA: Insufficient documentation

## 2024-01-28 LAB — GROUP A STREP BY PCR: Group A Strep by PCR: NOT DETECTED

## 2024-01-28 NOTE — ED Triage Notes (Signed)
 Pt arrives with c/o cough, sore throat, fevers, nausea and headache that started about 2 days ago.

## 2024-01-29 ENCOUNTER — Emergency Department
Admission: EM | Admit: 2024-01-29 | Discharge: 2024-01-29 | Disposition: A | Discharge status: Home / Self Care | Attending: Emergency Medicine | Admitting: Emergency Medicine

## 2024-01-29 DIAGNOSIS — J101 Influenza due to other identified influenza virus with other respiratory manifestations: Secondary | ICD-10-CM

## 2024-01-29 LAB — RESP PANEL BY RT-PCR (RSV, FLU A&B, COVID)  RVPGX2
Influenza A by PCR: NEGATIVE
Influenza B by PCR: POSITIVE — AB
Resp Syncytial Virus by PCR: NEGATIVE
SARS Coronavirus 2 by RT PCR: NEGATIVE

## 2024-01-29 MED ORDER — HYDROCOD POLI-CHLORPHE POLI ER 10-8 MG/5ML PO SUER: 5.0000 mL | Freq: Two times a day (BID) | ORAL | 0 refills | Status: AC | PRN

## 2024-01-29 MED ORDER — OSELTAMIVIR PHOSPHATE 75 MG PO CAPS: 75.0000 mg | ORAL_CAPSULE | Freq: Two times a day (BID) | ORAL | 0 refills | Status: AC

## 2024-01-29 MED ORDER — HYDROCOD POLI-CHLORPHE POLI ER 10-8 MG/5ML PO SUER
5.0000 mL | Freq: Once | ORAL | Status: AC
  Administered 2024-01-29: 5 mL via ORAL
  Filled 2024-01-29: qty 5

## 2024-01-29 NOTE — ED Notes (Signed)
 Pt came back into the ER and stated "I think you called my name?"" Pt was pulled from OTF to waiting room.

## 2024-01-29 NOTE — Discharge Instructions (Signed)
 Take and finish Tamiflu as prescribed.  You may take Tussionex as needed for cough.  Drink plenty of fluids daily.  Return to the ER for worsening symptoms, persistent vomiting, difficulty breathing or other concerns.

## 2024-01-29 NOTE — ED Provider Notes (Signed)
 [facility] Provider Note    Event Date/Time   First MD Initiated Contact with Patient 01/29/24 0153     (approximate)   History   URI   HPI  Shannon Alvarado is a 28 y.o. female who presents to the ED from home with a 2-day history of dry cough, sore throat, fever, nausea and headache.  Family member also sick with similar symptoms.  Denies chest pain, shortness of breath, abdominal pain, vomiting or diarrhea.     Past Medical History   Past Medical History:  Diagnosis Date   Asthma    Irregular periods    Obesity      Active Problem List   Patient Active Problem List   Diagnosis Date Noted   Postpartum care following vaginal delivery 10/08/2022   Obesity 06/24/2015     Past Surgical History  History reviewed. No pertinent surgical history.   Home Medications   Prior to Admission medications   Medication Sig Start Date End Date Taking? Authorizing Provider  chlorpheniramine-HYDROcodone (TUSSIONEX) 10-8 MG/5ML Take 5 mLs by mouth every 12 (twelve) hours as needed for cough. 01/29/24  Yes Irean Hong, MD  oseltamivir (TAMIFLU) 75 MG capsule Take 1 capsule (75 mg total) by mouth 2 (two) times daily. 01/29/24  Yes Irean Hong, MD  albuterol (VENTOLIN HFA) 108 (90 Base) MCG/ACT inhaler Inhale 2 puffs into the lungs every 6 (six) hours as needed for wheezing or shortness of breath. 11/01/20   Lucy Chris, PA  Prenatal Vit-Fe Fumarate-FA (PRENATAL MULTIVITAMIN) TABS tablet Take 1 tablet by mouth daily at 12 noon.    [provider]     Allergies  Patient has no known allergies.   Family History   Family History  Problem Relation Age of Onset   Cancer Mother        ovarian   Ovarian cancer Mother    Cancer Maternal Aunt        breast   Breast cancer Maternal Aunt    Hyperlipidemia Father    Hypertension Father      Physical Exam  Triage Vital Signs: ED Triage Vitals  Encounter Vitals Group     BP 01/28/24  2257 131/82     Systolic BP Percentile --      Diastolic BP Percentile --      Pulse Rate 01/28/24 2257 77     Resp 01/28/24 2257 16     Temp 01/28/24 2257 98.7 F (37.1 C)     Temp Source 01/28/24 2258 Oral     SpO2 01/28/24 2257 100 %     Weight 01/28/24 2257 196 lb (88.9 kg)     Height --      Head Circumference --      Peak Flow --      Pain Score 01/28/24 2257 7     Pain Loc --      Pain Education --      Exclude from Growth Chart --     Updated Vital Signs: BP 127/80   Pulse 73   Temp 98.4 F (36.9 C) (Oral)   Resp 17   Wt 88.9 kg   SpO2 99%   BMI 34.72 kg/m    General: Awake, no distress.  CV:  RRR.  Good peripheral perfusion.  Resp:  Normal effort.  CTAB.  Active dry cough noted. Abd:  No distention.  Other:  Nasal congestion noted.  Mildly erythematous oropharynx without tonsillar swelling, exudates  or peritonsillar abscess.  There is no hoarse or muffled voice.  There is no drooling.  No cervical LAD.   ED Results / Procedures / Treatments  Labs (all labs ordered are listed, but only abnormal results are displayed) Labs Reviewed  RESP PANEL BY RT-PCR (RSV, FLU A&B, COVID)  [id number] - Abnormal; Notable for the following components:      Result Value   Influenza B by PCR POSITIVE (*)    All other components within normal limits  GROUP A STREP BY PCR     EKG  None   RADIOLOGY None   Official radiology report(s): No results found.   PROCEDURES:  Critical Care performed: No  Procedures   MEDICATIONS ORDERED IN ED: Medications  chlorpheniramine-HYDROcodone (TUSSIONEX) 10-8 MG/5ML suspension 5 mL (5 mLs Oral Given 01/29/24 0248)     IMPRESSION / MDM / ASSESSMENT AND PLAN / ED COURSE  I reviewed the triage vital signs and the nursing notes.                             28 year old female presenting with cold-like symptoms.  She is positive for Influenza B.  Does want to take Tamiflu.  Will discharge home on Tamiflu and Tussionex to take as  needed.  Strict return precautions given.  Patient verbalizes understanding and agrees with plan of care.  Patient's presentation is most consistent with acute, uncomplicated illness.    FINAL CLINICAL IMPRESSION(S) / ED DIAGNOSES   Final diagnoses:  Influenza B     Rx / DC Orders   ED Discharge Orders          Ordered    oseltamivir (TAMIFLU) 75 MG capsule  2 times daily        01/29/24 0231    chlorpheniramine-HYDROcodone (TUSSIONEX) 10-8 MG/5ML  Every 12 hours PRN        01/29/24 0231             Note:  This document was prepared using Dragon voice recognition software and may include unintentional dictation errors.   Irean Hong, MD 01/29/24 [phone]
